# Patient Record
Sex: Female | Born: 1990 | Race: Black or African American | Hispanic: No | Marital: Single | Smoking: Never smoker
Health system: Southern US, Community
[De-identification: ages and names within clinical notes are randomized; demographics above are authoritative.]

## PROBLEM LIST (undated history)

## (undated) DIAGNOSIS — K219 Gastro-esophageal reflux disease without esophagitis: Secondary | ICD-10-CM

## (undated) DIAGNOSIS — F419 Anxiety disorder, unspecified: Secondary | ICD-10-CM

## (undated) HISTORY — PX: ANKLE SURGERY: SHX546

---

## 2015-08-28 ENCOUNTER — Encounter (HOSPITAL_COMMUNITY): Payer: Self-pay

## 2015-08-28 ENCOUNTER — Emergency Department (HOSPITAL_COMMUNITY): Payer: BLUE CROSS/BLUE SHIELD

## 2015-08-28 ENCOUNTER — Emergency Department (HOSPITAL_COMMUNITY)
Admission: EM | Admit: 2015-08-28 | Discharge: 2015-08-28 | Disposition: A | Discharge status: Home / Self Care | Payer: BLUE CROSS/BLUE SHIELD | Attending: Emergency Medicine | Admitting: Emergency Medicine

## 2015-08-28 DIAGNOSIS — Y9364 Activity, baseball: Secondary | ICD-10-CM | POA: Diagnosis not present

## 2015-08-28 DIAGNOSIS — Y998 Other external cause status: Secondary | ICD-10-CM | POA: Insufficient documentation

## 2015-08-28 DIAGNOSIS — X58XXXA Exposure to other specified factors, initial encounter: Secondary | ICD-10-CM | POA: Insufficient documentation

## 2015-08-28 DIAGNOSIS — S99911A Unspecified injury of right ankle, initial encounter: Secondary | ICD-10-CM | POA: Diagnosis present

## 2015-08-28 DIAGNOSIS — S82851A Displaced trimalleolar fracture of right lower leg, initial encounter for closed fracture: Secondary | ICD-10-CM | POA: Diagnosis not present

## 2015-08-28 DIAGNOSIS — Y9289 Other specified places as the place of occurrence of the external cause: Secondary | ICD-10-CM | POA: Diagnosis not present

## 2015-08-28 MED ORDER — KETAMINE HCL-SODIUM CHLORIDE 100-0.9 MG/10ML-% IV SOSY
0.3000 mg/kg | PREFILLED_SYRINGE | Freq: Once | INTRAVENOUS | Status: AC
  Administered 2015-08-28: 24 mg via INTRAVENOUS

## 2015-08-28 MED ORDER — MORPHINE SULFATE (PF) 4 MG/ML IV SOLN
4.0000 mg | Freq: Once | INTRAVENOUS | Status: AC
  Administered 2015-08-28: 4 mg via INTRAVENOUS
  Filled 2015-08-28: qty 1

## 2015-08-28 MED ORDER — ONDANSETRON HCL 4 MG/2ML IJ SOLN
4.0000 mg | Freq: Once | INTRAMUSCULAR | Status: AC
  Administered 2015-08-28: 4 mg via INTRAVENOUS
  Filled 2015-08-28: qty 2

## 2015-08-28 MED ORDER — HYDROCODONE-ACETAMINOPHEN 5-325 MG PO TABS: 1.0000 | ORAL_TABLET | Freq: Four times a day (QID) | ORAL | Status: DC | PRN

## 2015-08-28 MED ORDER — KETAMINE HCL 10 MG/ML IJ SOLN: 0.3000 mg/kg | Freq: Once | INTRAMUSCULAR | Status: DC

## 2015-08-28 MED ORDER — KETAMINE HCL-SODIUM CHLORIDE 100-0.9 MG/10ML-% IV SOSY
24.0000 mg | PREFILLED_SYRINGE | Freq: Once | INTRAVENOUS | Status: DC
  Filled 2015-08-28: qty 10

## 2015-08-28 NOTE — Discharge Instructions (Signed)
Ankle Fracture A fracture is a break in a bone. The ankle joint is made up of three bones. These include the lower (distal)sections of your lower leg bones, called the tibia and fibula, along with a bone in your foot, called the talus. Depending on how bad the break is and if more than one ankle joint bone is broken, a cast or splint is used to protect and keep your injured bone from moving while it heals. Sometimes, surgery is required to help the fracture heal properly.  There are two general types of fractures:  Stable fracture. This includes a single fracture line through one bone, with no injury to ankle ligaments. A fracture of the talus that does not have any displacement (movement of the bone on either side of the fracture line) is also stable.  Unstable fracture. This includes more than one fracture line through one or more bones in the ankle joint. It also includes fractures that have displacement of the bone on either side of the fracture line. CAUSES  A direct blow to the ankle.   Quickly and severely twisting your ankle.  Trauma, such as a car accident or falling from a significant height. RISK FACTORS You may be at a higher risk of ankle fracture if:  You have certain medical conditions.  You are involved in high-impact sports.  You are involved in a high-impact car accident. SIGNS AND SYMPTOMS   Tender and swollen ankle.  Bruising around the injured ankle.  Pain on movement of the ankle.  Difficulty walking or putting weight on the ankle.  A cold foot below the site of the ankle injury. This can occur if the blood vessels passing through your injured ankle were also damaged.  Numbness in the foot below the site of the ankle injury. DIAGNOSIS  An ankle fracture is usually diagnosed with a physical exam and X-rays. A CT scan may also be required for complex fractures. TREATMENT  Stable fractures are treated with a cast or splint and using crutches to avoid putting  weight on your injured ankle. This is followed by an ankle strengthening program. Some patients require a special type of cast, depending on other medical problems they may have. Unstable fractures require surgery to ensure the bones heal properly. Your health care provider will tell you what type of fracture you have and the best treatment for your condition. HOME CARE INSTRUCTIONS   Review correct crutch use with your health care provider and use your crutches as directed. Safe use of crutches is extremely important. Misuse of crutches can cause you to fall or cause injury to nerves in your hands or armpits.  Do not put weight or pressure on the injured ankle until directed by your health care provider.  To lessen the swelling, keep the injured leg elevated while sitting or lying down.  Apply ice to the injured area:  Put ice in a plastic bag.  Place a towel between your cast and the bag.  Leave the ice on for 20 minutes, 2-3 times a day.  If you have a plaster or fiberglass cast:  Do not try to scratch the skin under the cast with any objects. This can increase your risk of skin infection.  Check the skin around the cast every day. You may put lotion on any red or sore areas.  Keep your cast dry and clean.  If you have a plaster splint:  Wear the splint as directed.  You may loosen the elastic   around the splint if your toes become numb, tingle, or turn cold or blue.  Do not put pressure on any part of your cast or splint; it may break. Rest your cast only on a pillow the first 24 hours until it is fully hardened.  Your cast or splint can be protected during bathing with a plastic bag sealed to your skin with medical tape. Do not lower the cast or splint into water.  Take medicines as directed by your health care provider. Only take over-the-counter or prescription medicines for pain, discomfort, or fever as directed by your health care provider.  Do not drive a vehicle until  your health care provider specifically tells you it is safe to do so.  If your health care provider has given you a follow-up appointment, it is very important to keep that appointment. Not keeping the appointment could result in a chronic or permanent injury, pain, and disability. If you have any problem keeping the appointment, call the facility for assistance. SEEK MEDICAL CARE IF: You develop increased swelling or discomfort. SEEK IMMEDIATE MEDICAL CARE IF:   Your cast gets damaged or breaks.  You have continued severe pain.  You develop new pain or swelling after the cast was put on.  Your skin or toenails below the injury turn blue or gray.  Your skin or toenails below the injury feel cold, numb, or have loss of sensitivity to touch.  There is a bad smell or pus draining from under the cast. MAKE SURE YOU:   Understand these instructions.  Will watch your condition.  Will get help right away if you are not doing well or get worse.   This information is not intended to replace advice given to you by your health care provider. Make sure you discuss any questions you have with your health care provider.   Document Released: 05/05/2000 Document Revised: 05/13/2013 Document Reviewed: 12/05/2012 Elsevier Interactive Patient Education 2016 [employer]  

## 2015-08-28 NOTE — ED Notes (Signed)
April LauthPlunkett MD, Brett Canalesoy Ortho tech Rushie ChestnutBrian Le Faulcon RN at beside for reduction of right ankle. No sedation was used. Analgesics only. Tolerated well by patient

## 2015-08-28 NOTE — ED Notes (Signed)
Pt stable, ambulatory, states understanding of discharge instructions 

## 2015-08-28 NOTE — ED Notes (Addendum)
Patient arrived via EMS with an ankle injury after sliding into a base playing softball.  Heard a crack and laid on the ground.  Had a deformity on the right ankle, has pedal pulses present. EMS gave 150 mcg of Fentanyl

## 2015-08-28 NOTE — ED Provider Notes (Addendum)
CSN: [id number]     Arrival date & time 08/28/15  1931 History   First MD Initiated Contact with Patient 08/28/15 1932     Chief Complaint  Patient presents with  . Ankle Injury     (Consider location/radiation/quality/duration/timing/severity/associated sxs/prior Treatment) Patient is a 25 y.o. female presenting with lower extremity injury. The history is provided by the patient.  Ankle Injury This is a new (sliding into base and her foot went one way and leg went the other) problem. The current episode started 1 to 2 hours ago. The problem occurs constantly. The problem has not changed since onset.Associated symptoms comments: Right ankle pain.  No knee pain or head injury. Exacerbated by: weight bearing. Nothing relieves the symptoms. She has tried nothing for the symptoms. The treatment provided no relief.    History reviewed. No pertinent past medical history. History reviewed. No pertinent past surgical history. History reviewed. No pertinent family history. Social History  Substance Use Topics  . Smoking status: Never Smoker   . Smokeless tobacco: Never Used  . Alcohol Use: None   OB History    No data available     Review of Systems  All other systems reviewed and are negative.     Allergies  Review of patient's allergies indicates no known allergies.  Home Medications   Prior to Admission medications   Not on File   BP 127/74 mmHg  Pulse 104  Temp(Src) 98.2 F (36.8 C) (Oral)  Resp 18  Ht  (1.6 m)  Wt 175 lb (79.379 kg)  BMI 31.01 kg/m2  SpO2 99%  LMP 08/09/2015 (LMP Unknown) Physical Exam  Constitutional: She is oriented to person, place, and time. She appears well-developed and well-nourished. No distress.  HENT:  Head: Normocephalic and atraumatic.  Eyes: EOM are normal. Pupils are equal, round, and reactive to light.  Cardiovascular: Normal rate, regular rhythm, normal heart sounds and intact distal pulses.  Exam reveals no friction rub.   No  murmur heard. Pulmonary/Chest: Effort normal and breath sounds normal. She has no wheezes. She has no rales.  Musculoskeletal: She exhibits tenderness.       Right ankle: She exhibits decreased range of motion, swelling and deformity. Tenderness. Lateral malleolus and medial malleolus tenderness found.  No edema  Neurological: She is alert and oriented to person, place, and time. No cranial nerve deficit.  Skin: Skin is warm and dry. No rash noted.  Psychiatric: She has a normal mood and affect. Her behavior is normal.  Nursing note and vitals reviewed.   ED Course  Reduction of fracture Date/Time: 08/28/2015 9:00 PM Performed by: Gwyneth Sprout Authorized by: Gwyneth Sprout Consent: Verbal consent obtained. Risks and benefits: risks, benefits and alternatives were discussed Consent given by: patient Site marked: the operative site was marked Patient identity confirmed: verbally with patient Local anesthesia used: no Patient sedated: no Patient tolerance: Patient tolerated the procedure well with no immediate complications Comments: After analgesic doses of ketamine and morphine with gentle traction on the foot and plantar flexion facture dislocation reduced with a pop and appears in more appropriate alignment.   (including critical care time) Labs Review Labs Reviewed - No data to display  Imaging Review Dg Ankle Complete Right  08/28/2015  CLINICAL DATA:  Injury playing softball EXAM: RIGHT ANKLE - COMPLETE 3+ VIEW COMPARISON:  None. FINDINGS: Fine bone detail diminished secondary to overlying casting material. Fractures involving scratch set there is an oblique, intra-articular fracture involving the distal fibula. Medial  malleolar fracture is also identified. A fracture involving the anterior malleolus cannot be excluded. Finally, there is a posterior malleolar fracture. IMPRESSION: 1. Status post close reduction of trimalleolar fracture. Consider further assessment with CT of  the right ankle. 2. Cannot rule out anterior malleolar fracture. Electronically Signed   By: Signa Kellaylor  Stroud M.D.   On: 08/28/2015 21:35   I have personally reviewed and evaluated these images and lab results as part of my medical decision-making.   EKG Interpretation None      MDM   Final diagnoses:  Trimalleolar fracture, right, closed, initial encounter   Patient with an injury to the right ankle while playing softball today with obvious deformity consistent with fracture dislocation of the right ankle. She is neurovascularly intact. No other injury noted at this time. No knee pain or deformity. Patient was given ketamine analgesia and morphine. Then the fracture dislocation was reduced at bedside. Patient was placed in a [place] splint and taken for x-ray. Pain significantly improved after reduction.  9:56 PM Imaging consistent with trimal fx.  Alignment appropriate.  Pt has good neuro exam after splint.  Will d/c home to f/u with ortho.  Gwyneth SproutWhitney Othar Curto, MD 08/28/15 [id number]  Gwyneth SproutWhitney Alyissa Whidbee, MD 08/28/15 2158

## 2015-08-28 NOTE — Progress Notes (Signed)
Orthopedic Tech Progress Note Patient Details:  April Dean 05-30-1990 [id number] Assisted with reduction then applied fiberglass short leg splint with stirrup to RLE.  Pulses, sensation, motion intact before and after splinting.  Capillary refill less than 2 seconds before and after splinting. Ortho Devices Type of Ortho Device: Post (short leg) splint, Stirrup splint Ortho Device/Splint Location: RLE Ortho Device/Splint Interventions: Application   Lesle ChrisGilliland, Tinisha Etzkorn L 08/28/2015, 8:39 PM

## 2016-04-22 ENCOUNTER — Emergency Department (HOSPITAL_BASED_OUTPATIENT_CLINIC_OR_DEPARTMENT_OTHER)
Admission: EM | Admit: 2016-04-22 | Discharge: 2016-04-22 | Disposition: A | Discharge status: Home / Self Care | Payer: BLUE CROSS/BLUE SHIELD | Attending: Emergency Medicine | Admitting: Emergency Medicine

## 2016-04-22 ENCOUNTER — Encounter (HOSPITAL_BASED_OUTPATIENT_CLINIC_OR_DEPARTMENT_OTHER): Payer: Self-pay | Admitting: *Deleted

## 2016-04-22 DIAGNOSIS — J029 Acute pharyngitis, unspecified: Secondary | ICD-10-CM | POA: Diagnosis present

## 2016-04-22 DIAGNOSIS — H1031 Unspecified acute conjunctivitis, right eye: Secondary | ICD-10-CM | POA: Diagnosis not present

## 2016-04-22 DIAGNOSIS — J069 Acute upper respiratory infection, unspecified: Secondary | ICD-10-CM | POA: Insufficient documentation

## 2016-04-22 LAB — RAPID STREP SCREEN (MED CTR MEBANE ONLY): Streptococcus, Group A Screen (Direct): NEGATIVE

## 2016-04-22 MED ORDER — DEXAMETHASONE 6 MG PO TABS
12.0000 mg | ORAL_TABLET | Freq: Once | ORAL | Status: AC
  Administered 2016-04-22: 12 mg via ORAL
  Filled 2016-04-22: qty 2

## 2016-04-22 MED ORDER — FLUORESCEIN SODIUM 1 MG OP STRP
1.0000 | ORAL_STRIP | Freq: Once | OPHTHALMIC | Status: AC
  Administered 2016-04-22: 1 via OPHTHALMIC
  Filled 2016-04-22: qty 1

## 2016-04-22 MED ORDER — AMOXICILLIN 250 MG/5ML PO SUSR
500.0000 mg | Freq: Three times a day (TID) | ORAL | Status: DC
  Administered 2016-04-22: 500 mg via ORAL
  Filled 2016-04-22: qty 10

## 2016-04-22 MED ORDER — SULFACETAMIDE SODIUM 10 % OP SOLN: 1.0000 [drp] | OPHTHALMIC | 0 refills | Status: DC

## 2016-04-22 MED ORDER — AMOXICILLIN 250 MG/5ML PO SUSR: 500.0000 mg | Freq: Three times a day (TID) | ORAL | 0 refills | Status: DC

## 2016-04-22 MED ORDER — TETRACAINE HCL 0.5 % OP SOLN
2.0000 [drp] | Freq: Once | OPHTHALMIC | Status: AC
  Administered 2016-04-22: 2 [drp] via OPHTHALMIC
  Filled 2016-04-22: qty 4

## 2016-04-22 NOTE — ED Provider Notes (Signed)
[facility]   CSN: [id number] Arrival date & time: 04/22/16  2150   By signing my name below, I, Teofilo PodMatthew P. Jamison, attest that this documentation has been prepared under the direction and in the presence of Dione Boozeavid Zarek Relph, MD . Electronically Signed: Teofilo PodMatthew P. Jamison, ED Scribe. 04/22/2016. 11:14 PM.   History   Chief Complaint Chief Complaint  Patient presents with  . Eye Problem     The history is provided by the patient. No language interpreter was used.   HPI Comments:  April Dean is a 25 y.o. female who presents to the Emergency Department complaining of worseningright eye pain pain that began today. Pt complains of associated blurred vision, yellow eye drainage and redness. Pt reports using new contacts yesterday. Pt also complains of sore throat, productive cough with green sputum, chills, diaphoresis, sneezing, generalized body aches. Pt states that these symptoms have persisted for 1 week. Pt reports possible sick contact at work. Pt had a flu shot this year. No alleviating factors noted. Pt denies vomiting, diarrhea.    History reviewed. No pertinent past medical history.  There are no active problems to display for this patient.   Past Surgical History:  Procedure Laterality Date  . ANKLE SURGERY Right     OB History    No data available       Home Medications    Prior to Admission medications   Not on File    Family History History reviewed. No pertinent family history.  Social History Social History  Substance Use Topics  . Smoking status: Never Smoker  . Smokeless tobacco: Never Used  . Alcohol use No     Allergies   Robitussin 12 hour cough [dextromethorphan polistirex er]   Review of Systems Review of Systems  Constitutional: Positive for chills and diaphoresis. Negative for fever.  HENT: Positive for sneezing and sore throat.   Eyes: Positive for pain, discharge, redness and visual disturbance.    Respiratory: Positive for cough.   Gastrointestinal: Negative for vomiting.  Musculoskeletal: Positive for myalgias.  All other systems reviewed and are negative.    Physical Exam Updated Vital Signs BP 117/81 (BP Location: Left Arm)   Pulse 89   Temp 97.7 F (36.5 C) (Oral)   Resp 19   Ht 5\' 2"  (1.575 m)   Wt 174 lb (78.9 kg)   SpO2 100%   BMI 31.83 kg/m   Physical Exam  Constitutional: She is oriented to person, place, and time. She appears well-developed and well-nourished.  HENT:  Head: Normocephalic and atraumatic.  Eyes: EOM are normal. Pupils are equal, round, and reactive to light.  Mild erythema of the conjunctiva of right eye, no corneal foreign body, anterior chamber clear, no abnormal uptake of fluorescin dye.   Neck: Normal range of motion. Neck supple. No JVD present.  Cardiovascular: Normal rate, regular rhythm and normal heart sounds.   No murmur heard. Pulmonary/Chest: Effort normal and breath sounds normal. She has no wheezes. She has no rales. She exhibits no tenderness.  Abdominal: Soft. Bowel sounds are normal. She exhibits no distension and no mass. There is no tenderness.  Musculoskeletal: Normal range of motion. She exhibits no edema.  Lymphadenopathy:    She has no cervical adenopathy.  Neurological: She is alert and oriented to person, place, and time. No cranial nerve deficit. She exhibits normal muscle tone. Coordination normal.  Skin: Skin is warm and dry. No rash noted.  Psychiatric: She has a normal  mood and affect. Her behavior is normal. Judgment and thought content normal.  Nursing note and vitals reviewed.    ED Treatments / Results  DIAGNOSTIC STUDIES:  Oxygen Saturation is 100% on RA, normal by my interpretation.    COORDINATION OF CARE:  11:14 PM Discussed treatment plan with pt at bedside and pt agreed to plan.   Labs (all labs ordered are listed, but only abnormal results are displayed) Labs Reviewed  RAPID STREP SCREEN  (NOT AT [facility])  CULTURE, GROUP A STREP [facility])     Procedures Procedures (including critical care time)  Medications Ordered in ED Medications  dexamethasone (DECADRON) tablet 12 mg (not administered)  tetracaine (PONTOCAINE) 0.5 % ophthalmic solution 2 drop (2 drops Right Eye Given 04/22/16 2258)  fluorescein ophthalmic strip 1 strip (1 strip Right Eye Given 04/22/16 2258)     Initial Impression / Assessment and Plan / ED Course  I have reviewed the triage vital signs and the nursing notes.  Pertinent lab results that were available during my care of the patient were reviewed by me and considered in my medical decision making (see chart for details).  Clinical Course    Respiratory tract infection with ongoing. Went rhinorrhea. Conjunctivitis of the right eye. While all this appears to be part of a viral illness, I'm concerned about possible bacterial superinfection. She is given a prescription for amoxicillin and for sulfacetamide ophthalmic solution. Return precautions discussed.  Final Clinical Impressions(s) / ED Diagnoses   Final diagnoses:  Upper respiratory tract infection, unspecified type  Pharyngitis, unspecified etiology  Acute conjunctivitis of right eye, unspecified acute conjunctivitis type    New Prescriptions Discharge Medication List as of 04/22/2016 11:23 PM    START taking these medications   Details  amoxicillin (AMOXIL) 250 MG/5ML suspension Take 10 mLs (500 mg total) by mouth 3 (three) times daily., Starting Sat 04/22/2016, Print    sulfacetamide (BLEPH-10) 10 % ophthalmic solution Place 1-2 drops into the right eye every 3 (three) hours while awake., Starting Sat 04/22/2016, Print       I personally performed the services described in this documentation, which was scribed in my presence. The recorded information has been reviewed and is accurate.       Dione Boozeavid Jadynn Epping, MD 04/23/16 [id number]

## 2016-04-22 NOTE — ED Triage Notes (Addendum)
Pt reports right eye redness and drainage x 1 day.  Reports new contact use recently.   Also reports sore throat.

## 2016-04-22 NOTE — ED Notes (Signed)
Dr. Glick at BS 

## 2016-04-22 NOTE — ED Notes (Signed)
Pt given d/c instructions as per chart. Verbalizes understanding. No questions. Rx x 2. 

## 2016-04-24 ENCOUNTER — Telehealth: Payer: Self-pay | Admitting: *Deleted

## 2016-04-26 LAB — CULTURE, GROUP A STREP (THRC)

## 2016-06-13 ENCOUNTER — Encounter (HOSPITAL_BASED_OUTPATIENT_CLINIC_OR_DEPARTMENT_OTHER): Payer: Self-pay

## 2016-06-13 DIAGNOSIS — K029 Dental caries, unspecified: Secondary | ICD-10-CM | POA: Diagnosis not present

## 2016-06-13 DIAGNOSIS — K0889 Other specified disorders of teeth and supporting structures: Secondary | ICD-10-CM | POA: Diagnosis present

## 2016-06-13 NOTE — ED Triage Notes (Signed)
C/o right ear ache since filling to right lower tooth Jan 9-NAD-steady gait

## 2016-06-14 ENCOUNTER — Emergency Department (HOSPITAL_BASED_OUTPATIENT_CLINIC_OR_DEPARTMENT_OTHER)
Admission: EM | Admit: 2016-06-14 | Discharge: 2016-06-14 | Disposition: A | Discharge status: Home / Self Care | Payer: BLUE CROSS/BLUE SHIELD | Attending: Emergency Medicine | Admitting: Emergency Medicine

## 2016-06-14 DIAGNOSIS — K0889 Other specified disorders of teeth and supporting structures: Secondary | ICD-10-CM

## 2016-06-14 MED ORDER — IBUPROFEN 600 MG PO TABS: 600.0000 mg | ORAL_TABLET | Freq: Four times a day (QID) | ORAL | 0 refills | Status: DC | PRN

## 2016-06-14 NOTE — ED Provider Notes (Signed)
[facility]   CSN: [id number] Arrival date & time: 06/13/16  2126  History   Chief Complaint Chief Complaint  Patient presents with  . Otalgia    HPI April Dean is a 26 y.o. female.  HPI  26 y.o. female, presents to the Emergency Department today complaining of dental pain on right lower mandible. Notes radiation up to right ear. States pain 4/10 currently. No N/V. No trouble with PO intake. No fevers. Pt states seen by Dentist on 13th for filling. Notes pain in that area since then. Tylenol with minimal relief. No other symptoms noted.   History reviewed. No pertinent past medical history.  There are no active problems to display for this patient.   Past Surgical History:  Procedure Laterality Date  . ANKLE SURGERY Right     OB History    No data available       Home Medications    Prior to Admission medications   Not on File    Family History No family history on file.  Social History Social History  Substance Use Topics  . Smoking status: Never Smoker  . Smokeless tobacco: Never Used  . Alcohol use No     Allergies   Robitussin 12 hour cough [dextromethorphan polistirex er]   Review of Systems Review of Systems  Constitutional: Negative for fever.  HENT: Positive for dental problem. Negative for sore throat.   Respiratory: Negative for shortness of breath and stridor.   Gastrointestinal: Negative for nausea and vomiting.  Allergic/Immunologic: Negative for immunocompromised state.   Physical Exam Updated Vital Signs BP 127/81 (BP Location: Left Arm)   Pulse 75   Temp 97.7 F (36.5 C) (Oral)   Resp 18   Ht 5\' 2"  (1.575 m)   Wt 81.2 kg   LMP 06/09/2016   SpO2 100%   BMI 32.74 kg/m   Physical Exam  Constitutional: She is oriented to person, place, and time. Vital signs are normal. She appears well-developed and well-nourished.  HENT:  Head: Normocephalic and atraumatic.  Right Ear: Hearing normal.  Left  Ear: Hearing normal.  Mouth/Throat: Uvula is midline, oropharynx is clear and moist and mucous membranes are normal. No trismus in the jaw. Normal dentition. No dental abscesses or uvula swelling.  Dental filling noted right lower mandible. No abscess. No erythema. No trismus  Eyes: Conjunctivae and EOM are normal. Pupils are equal, round, and reactive to light.  Neck: Normal range of motion. Neck supple.  Cardiovascular: Normal rate and regular rhythm.   Pulmonary/Chest: Effort normal.  Neurological: She is alert and oriented to person, place, and time.  Skin: Skin is warm and dry.  Psychiatric: She has a normal mood and affect. Her speech is normal and behavior is normal. Thought content normal.  Nursing note and vitals reviewed.  ED Treatments / Results  Labs (all labs ordered are listed, but only abnormal results are displayed) Labs Reviewed - No data to display  EKG  EKG Interpretation None       Radiology No results found.  Procedures Procedures (including critical care time)  Medications Ordered in ED Medications - No data to display   Initial Impression / Assessment and Plan / ED Course  I have reviewed the triage vital signs and the nursing notes.  Pertinent labs & imaging results that were available during my care of the patient were reviewed by me and considered in my medical decision making (see chart for details).  Final Clinical Impressions(s) /  ED Diagnoses     {I have reviewed the relevant previous healthcare records.  {I obtained HPI from historian.   ED Course:  Assessment: Dental pain associated with dental cary but no signs or symptoms of dental abscess with patient afebrile, non toxic appearing and swallowing secretions well. Exam unconcerning for Ludwig's angina or other deep tissue infection in neck. As there is no facial swelling or gum findings, will not prescribe antibiotics at this time. Will treat with pain medication. Likely residual pain from  filling on nerve root.  I gave patient referral to dentist and stressed the importance of dental follow up for ultimate management of dental pain. Patient voices understanding and is agreeable to plan.  Pt also requested referral to OBGYN for birth control   Disposition/Plan:  DC Home Additional Verbal discharge instructions given and discussed with patient.  Pt Instructed to f/u with Dentist in the next week for evaluation and treatment of symptoms. Return precautions given Pt acknowledges and agrees with plan  Supervising Physician April Palumbo, MD  Final diagnoses:  Pain, dental    New Prescriptions New Prescriptions   No medications on file     Audry Piliyler Amaan Meyer, PA-C 06/14/16 [id number]    April Palumbo, MD 06/14/16 [phone]

## 2016-06-14 NOTE — Discharge Instructions (Signed)
Please read and follow all provided instructions.  Your diagnoses today include:  1. Pain, dental     Tests performed today include: Vital signs. See below for your results today.   Medications prescribed:  Take as prescribed   Home care instructions:  Follow any educational materials contained in this packet.  Follow-up instructions: Please follow-up with your Dentist for further evaluation of symptoms and treatment   Return instructions:  Please return to the Emergency Department if you do not get better, if you get worse, or new symptoms OR  - Fever (temperature greater than 101.27F)  - Bleeding that does not stop with holding pressure to the area    -Severe pain (please note that you may be more sore the day after your accident)  - Chest Pain  - Difficulty breathing  - Severe nausea or vomiting  - Inability to tolerate food and liquids  - Passing out  - Skin becoming red around your wounds  - Change in mental status (confusion or lethargy)  - New numbness or weakness    Please return if you have any other emergent concerns.  Additional Information:  Your vital signs today were: BP 127/81 (BP Location: Left Arm)    Pulse 75    Temp 97.7 F (36.5 C) (Oral)    Resp 18    Ht 5\' 2"  (1.575 m)    Wt 81.2 kg    LMP 06/09/2016    SpO2 100%    BMI 32.74 kg/m  If your blood pressure (BP) was elevated above 135/85 this visit, please have this repeated by your doctor within one month. ---------------

## 2017-01-17 ENCOUNTER — Encounter (HOSPITAL_BASED_OUTPATIENT_CLINIC_OR_DEPARTMENT_OTHER): Payer: Self-pay | Admitting: Emergency Medicine

## 2017-01-17 ENCOUNTER — Emergency Department (HOSPITAL_BASED_OUTPATIENT_CLINIC_OR_DEPARTMENT_OTHER): Payer: BLUE CROSS/BLUE SHIELD

## 2017-01-17 ENCOUNTER — Emergency Department (HOSPITAL_BASED_OUTPATIENT_CLINIC_OR_DEPARTMENT_OTHER)
Admission: EM | Admit: 2017-01-17 | Discharge: 2017-01-17 | Disposition: A | Discharge status: Home / Self Care | Payer: BLUE CROSS/BLUE SHIELD | Attending: Emergency Medicine | Admitting: Emergency Medicine

## 2017-01-17 DIAGNOSIS — M25571 Pain in right ankle and joints of right foot: Secondary | ICD-10-CM | POA: Insufficient documentation

## 2017-01-17 DIAGNOSIS — M79671 Pain in right foot: Secondary | ICD-10-CM

## 2017-01-17 MED ORDER — OXYCODONE-ACETAMINOPHEN 5-325 MG PO TABS: 1.0000 | ORAL_TABLET | Freq: Once | ORAL | Status: DC

## 2017-01-17 MED ORDER — ACETAMINOPHEN 325 MG PO TABS
650.0000 mg | ORAL_TABLET | Freq: Once | ORAL | Status: AC
  Administered 2017-01-17: 650 mg via ORAL
  Filled 2017-01-17: qty 2

## 2017-01-17 MED ORDER — IBUPROFEN 400 MG PO TABS
400.0000 mg | ORAL_TABLET | Freq: Once | ORAL | Status: AC
  Administered 2017-01-17: 400 mg via ORAL
  Filled 2017-01-17: qty 1

## 2017-01-17 NOTE — ED Notes (Signed)
Pt drove self , percocet held

## 2017-01-17 NOTE — ED Notes (Signed)
ED Provider at bedside. 

## 2017-01-17 NOTE — Discharge Instructions (Signed)
Follow up with your surgeon tomorrow, return for worsening symptoms, fever. Schedule an appointment with Dr. Linna CapriceSwinteck if you would like a second opinion on your ankle.

## 2017-01-17 NOTE — ED Triage Notes (Addendum)
Patient reports right foot swelling, burning and tingling x 1 month.  Reports previous surgery on 08/2015.  States foot has been okay until past month.  Reports follow up with orthopedist tomorrow.  Patient presents with cam walker in place.  States she began wearing this yesterday

## 2017-01-17 NOTE — ED Provider Notes (Signed)
[facility] Provider Note   CSN: [id number] Arrival date & time: 01/17/17  1706     History   Chief Complaint Chief Complaint  Patient presents with  . Foot Swelling    HPI April Dean is a 26 y.o. female.  26 yo F with a chief complaint of right ankle pain. This been a chronic issue for her. She had a fracture to the ankle sometime ago. Has been having swelling in pain over the past months or so. Saw her orthopedic physician about a month ago. Started on gabapentin. Denies any improvement of symptoms. Denies fever erythema. Denies trauma. She does admit to increasing her amount on her feet recently. Has been walking quite a bit.   The history is provided by the patient.  Illness  This is a new problem. The current episode started more than 1 week ago. The problem occurs constantly. The problem has been gradually worsening. Pertinent negatives include no chest pain, no headaches and no shortness of breath. The symptoms are aggravated by walking, bending, twisting and standing. Nothing relieves the symptoms. She has tried nothing for the symptoms. The treatment provided no relief.    History reviewed. No pertinent past medical history.  There are no active problems to display for this patient.   Past Surgical History:  Procedure Laterality Date  . ANKLE SURGERY Right     OB History    No data available       Home Medications    Prior to Admission medications   Medication Sig Start Date End Date Taking? Authorizing Provider  ibuprofen (ADVIL,MOTRIN) 600 MG tablet Take 1 tablet (600 mg total) by mouth every 6 (six) hours as needed. 06/14/16   Audry Pili, PA-C    Family History History reviewed. No pertinent family history.  Social History Social History  Substance Use Topics  . Smoking status: Never Smoker  . Smokeless tobacco: Never Used  . Alcohol use No     Allergies   Robitussin 12 hour cough [dextromethorphan polistirex er]   Review of  Systems Review of Systems  Constitutional: Negative for chills and fever.  HENT: Negative for congestion and rhinorrhea.   Eyes: Negative for redness and visual disturbance.  Respiratory: Negative for shortness of breath and wheezing.   Cardiovascular: Negative for chest pain and palpitations.  Gastrointestinal: Negative for nausea and vomiting.  Genitourinary: Negative for dysuria and urgency.  Musculoskeletal: Positive for arthralgias and myalgias.  Skin: Negative for pallor and wound.  Neurological: Negative for dizziness and headaches.     Physical Exam Updated Vital Signs BP 125/74 (BP Location: Right Arm)   Pulse 79   Temp 98 F (36.7 C) (Oral)   Resp 20   Ht 5\' 2"  (1.575 m)   Wt 79.8 kg (176 lb)   LMP 01/16/2017 (Exact Date)   SpO2 100%   BMI 32.19 kg/m   Physical Exam  Constitutional: She is oriented to person, place, and time. She appears well-developed and well-nourished. No distress.  HENT:  Head: Normocephalic and atraumatic.  Eyes: Pupils are equal, round, and reactive to light. EOM are normal.  Neck: Normal range of motion. Neck supple.  Cardiovascular: Normal rate and regular rhythm.  Exam reveals no gallop and no friction rub.   No murmur heard. Pulmonary/Chest: Effort normal. She has no wheezes. She has no rales.  Abdominal: Soft. She exhibits no distension and no mass. There is no tenderness. There is no guarding.  Musculoskeletal: She exhibits edema and tenderness.  Tenderness in the soft tissue below the medial malleolus of the right ankle. Intact pulse motor and sensation. Range of motion without significant tenderness. Not warm. No erythema.  Neurological: She is alert and oriented to person, place, and time.  Skin: Skin is warm and dry. She is not diaphoretic.  Psychiatric: She has a normal mood and affect. Her behavior is normal.  Nursing note and vitals reviewed.    ED Treatments / Results  Labs (all labs ordered are listed, but only abnormal  results are displayed) Labs Reviewed - No data to display  EKG  EKG Interpretation None       Radiology No results found.  Procedures Procedures (including critical care time)  Medications Ordered in ED Medications  oxyCODONE-acetaminophen (PERCOCET/ROXICET) 5-325 MG per tablet 1 tablet (not administered)  ibuprofen (ADVIL,MOTRIN) tablet 400 mg (400 mg Oral Given 01/17/17 1902)  acetaminophen (TYLENOL) tablet 650 mg (650 mg Oral Given 01/17/17 1902)     Initial Impression / Assessment and Plan / ED Course  I have reviewed the triage vital signs and the nursing notes.  Pertinent labs & imaging results that were available during my care of the patient were reviewed by me and considered in my medical decision making (see chart for details).     26 yo F With chronic right ankle pain. Does not appear to be infected. I doubt this is septic arthritis. She has an appointment with her orthopedic doctor tomorrow. She asked for a second opinion. Was given a referral to the orthopedist on call. Tylenol and NSAIDs. Discharge home.  7:21 PM:  I have discussed the diagnosis/risks/treatment options with the patient and believe the pt to be eligible for discharge home to follow-up with PCP, ortho. We also discussed returning to the ED immediately if new or worsening sx occur. We discussed the sx which are most concerning (e.g., sudden worsening pain, fever, inability to tolerate by mouth) that necessitate immediate return. Medications administered to the patient during their visit and any new prescriptions provided to the patient are listed below.  Medications given during this visit Medications  oxyCODONE-acetaminophen (PERCOCET/ROXICET) 5-325 MG per tablet 1 tablet (not administered)  ibuprofen (ADVIL,MOTRIN) tablet 400 mg (400 mg Oral Given 01/17/17 1902)  acetaminophen (TYLENOL) tablet 650 mg (650 mg Oral Given 01/17/17 1902)     The patient appears reasonably screen and/or stabilized for  discharge and I doubt any other medical condition or other [facility] requiring further screening, evaluation, or treatment in the ED at this time prior to discharge.    Final Clinical Impressions(s) / ED Diagnoses   Final diagnoses:  Foot pain, right    New Prescriptions Discharge Medication List as of 01/17/2017  6:54 PM       Melene PlanFloyd, Janice Seales, DO 01/17/17 1921

## 2017-11-15 ENCOUNTER — Emergency Department (HOSPITAL_BASED_OUTPATIENT_CLINIC_OR_DEPARTMENT_OTHER): Payer: BLUE CROSS/BLUE SHIELD

## 2017-11-15 ENCOUNTER — Other Ambulatory Visit: Payer: Self-pay

## 2017-11-15 ENCOUNTER — Emergency Department (HOSPITAL_BASED_OUTPATIENT_CLINIC_OR_DEPARTMENT_OTHER)
Admission: EM | Admit: 2017-11-15 | Discharge: 2017-11-15 | Disposition: A | Discharge status: Home / Self Care | Payer: BLUE CROSS/BLUE SHIELD | Attending: Emergency Medicine | Admitting: Emergency Medicine

## 2017-11-15 ENCOUNTER — Encounter (HOSPITAL_BASED_OUTPATIENT_CLINIC_OR_DEPARTMENT_OTHER): Payer: Self-pay | Admitting: Emergency Medicine

## 2017-11-15 DIAGNOSIS — G8929 Other chronic pain: Secondary | ICD-10-CM | POA: Insufficient documentation

## 2017-11-15 DIAGNOSIS — M25571 Pain in right ankle and joints of right foot: Secondary | ICD-10-CM | POA: Diagnosis not present

## 2017-11-15 MED ORDER — KETOROLAC TROMETHAMINE 60 MG/2ML IM SOLN
60.0000 mg | Freq: Once | INTRAMUSCULAR | Status: AC
  Administered 2017-11-15: 60 mg via INTRAMUSCULAR
  Filled 2017-11-15: qty 2

## 2017-11-15 MED ORDER — DICLOFENAC SODIUM 1 % TD GEL: 2.0000 g | Freq: Four times a day (QID) | TRANSDERMAL | 1 refills | Status: AC

## 2017-11-15 NOTE — Discharge Instructions (Signed)
Ice and elevate your foot when you can.  Wear your compression anklet when up and walking or at work.  Make sure you wear a good shoe to work.  Continue taking tylenol.

## 2017-11-15 NOTE — ED Provider Notes (Signed)
[facility] EMERGENCY DEPARTMENT Provider Note   CSN: [id number] Arrival date & time: 11/15/17  1704     History   Chief Complaint Chief Complaint  Patient presents with  . Ankle Pain    HPI April Dean is a 27 y.o. female.  Patient is a 27 year old female presenting today with right ankle pain.  Patient has a significant past medical history of ankle fracture with hardware placement 2 years ago after sliding into a base while playing softball.  She then had the hardware removed approximately 1 year ago and states since the hardware is been removed she has had ongoing issues with her ankle.  She typically has pain every day but today the pain was 10 out of 10 and she can barely walk on it.  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  She normally works 8 hours but today she worked 12 hours and thinks that is why the pain is worse.  She denies any new trauma or falls.  She has noticed some swelling of the ankle despite wearing her compression sock. She does take Tylenol arthritis as needed for pain which she has taken today.  She states all the surgeries were done by Dr. Samuel Bouche in [facility] but she does not wish to see him any longer.    Ankle Pain      History reviewed. No pertinent past medical history.  There are no active problems to display for this patient.   Past Surgical History:  Procedure Laterality Date  . ANKLE SURGERY Right      OB History   None      Home Medications    Prior to Admission medications   Medication Sig Start Date End Date Taking? Authorizing Provider  ibuprofen (ADVIL,MOTRIN) 600 MG tablet Take 1 tablet (600 mg total) by mouth every 6 (six) hours as needed. 06/14/16   Audry Pili, PA-C    Family History History reviewed. No pertinent family history.  Social History Social History   Tobacco Use  . Smoking status: Never Smoker  . Smokeless tobacco: Never Used  Substance Use Topics  . Alcohol  use: No  . Drug use: No     Allergies   Robitussin 12 hour cough [dextromethorphan polistirex er]   Review of Systems Review of Systems  All other systems reviewed and are negative.    Physical Exam Updated Vital Signs BP 106/64 (BP Location: Left Arm)   Pulse 90   Temp 99.3 F (37.4 C) (Oral)   Resp 18   Ht 5\' 2"  (1.575 m)   Wt 83.9 kg (185 lb)   LMP 11/14/2017 (Approximate)   SpO2 98%   BMI 33.84 kg/m   Physical Exam  Constitutional: She appears well-developed and well-nourished. No distress.  HENT:  Head: Normocephalic and atraumatic.  Cardiovascular: Normal rate.  Pulmonary/Chest: Effort normal.  Musculoskeletal: She exhibits tenderness.       Right ankle: She exhibits decreased range of motion and swelling. She exhibits no ecchymosis, no deformity and normal pulse. Tenderness. Lateral malleolus and medial malleolus tenderness found. No head of 5th metatarsal and no proximal fibula tenderness found. Achilles tendon normal.  Nursing note and vitals reviewed.    ED Treatments / Results  Labs (all labs ordered are listed, but only abnormal results are displayed) Labs Reviewed - No data to display  EKG None  Radiology Dg Ankle Complete Right  Result Date: 11/15/2017 CLINICAL DATA:  History  of right ankle fracture with hardware placement and removal. Right ankle pain. No recent injury. EXAM: RIGHT ANKLE - COMPLETE 3+ VIEW COMPARISON:  08/28/2015 right ankle radiographs FINDINGS: Mild diffuse soft tissue swelling. No acute fracture. No subluxation. Healed deformity in the right distal fibula and medial malleolus. Ghost holes are noted in the right distal fibula and distal tibia. 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 No suspicious focal osseous lesions. No radiopaque foreign bodies. 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 No acute fracture or subluxation. Healed deformities in the right distal fibula and medial  malleolus with ghost holes from prior hardware. Electronically Signed   By: Delbert PhenixJason A Poff M.D.   On: 11/15/2017 17:41    Procedures Procedures (including critical care time)  Medications Ordered in ED Medications - No data to display   Initial Impression / Assessment and Plan / ED Course  I have reviewed the triage vital signs and the nursing notes.  Pertinent labs & imaging results that were available during my care of the patient were reviewed by me and considered in my medical decision making (see chart for details).    Patient presenting today with worsening right ankle pain with a history of chronic ankle pain after an injury resulting in surgery and hardware.  Patient was standing on her feet longer today which is most likely the cause of the swelling and pain.  X-ray shows mild osteoarthritis in the tibiotalar joint but no acute fractures or subluxations.  She has healed deformities in the right distal fibula and medial malleolus with ghost holes from prior hardware.  Patient has no evidence of joint erythema or concern for septic joint.  Pulses are intact.  Patient encouraged to continue wearing her compression sock as well as elevating and icing the ankle.  She was also given Voltaren gel and encouraged to continue Tylenol arthritis.  She was given follow-up with sports medicine.    Final Clinical Impressions(s) / ED Diagnoses   Final diagnoses:  Chronic pain of right ankle    ED Discharge Orders        Ordered    diclofenac sodium (VOLTAREN) 1 % GEL  4 times daily     11/15/17 1755       Gwyneth SproutPlunkett, Maryclare Nydam, MD 11/15/17 1755

## 2017-11-15 NOTE — ED Triage Notes (Signed)
Patient reports right ankle pain after standing on it all day today.  Reports previously had 2 surgeries to this ankle.

## 2018-02-17 ENCOUNTER — Encounter (HOSPITAL_BASED_OUTPATIENT_CLINIC_OR_DEPARTMENT_OTHER): Payer: Self-pay | Admitting: Emergency Medicine

## 2018-02-17 ENCOUNTER — Emergency Department (HOSPITAL_BASED_OUTPATIENT_CLINIC_OR_DEPARTMENT_OTHER)
Admission: EM | Admit: 2018-02-17 | Discharge: 2018-02-17 | Disposition: A | Discharge status: Home / Self Care | Payer: BLUE CROSS/BLUE SHIELD | Attending: Emergency Medicine | Admitting: Emergency Medicine

## 2018-02-17 ENCOUNTER — Other Ambulatory Visit: Payer: Self-pay

## 2018-02-17 DIAGNOSIS — R102 Pelvic and perineal pain: Secondary | ICD-10-CM | POA: Diagnosis present

## 2018-02-17 DIAGNOSIS — N390 Urinary tract infection, site not specified: Secondary | ICD-10-CM | POA: Diagnosis not present

## 2018-02-17 LAB — URINALYSIS, ROUTINE W REFLEX MICROSCOPIC
BILIRUBIN URINE: NEGATIVE
Glucose, UA: NEGATIVE mg/dL
HGB URINE DIPSTICK: NEGATIVE
KETONES UR: NEGATIVE mg/dL
Nitrite: NEGATIVE
PH: 7.5 (ref 5.0–8.0)
Protein, ur: NEGATIVE mg/dL
Specific Gravity, Urine: 1.015 (ref 1.005–1.030)

## 2018-02-17 LAB — BASIC METABOLIC PANEL
Anion gap: 8 (ref 5–15)
BUN: 7 mg/dL (ref 6–20)
CHLORIDE: 102 mmol/L (ref 98–111)
CO2: 28 mmol/L (ref 22–32)
Calcium: 8.8 mg/dL — ABNORMAL LOW (ref 8.9–10.3)
Creatinine, Ser: 0.65 mg/dL (ref 0.44–1.00)
GFR calc non Af Amer: >60 mL/min (ref >60)
Glucose, Bld: 92 mg/dL (ref 70–99)
Potassium: 3.7 mmol/L (ref 3.5–5.1)
SODIUM: 138 mmol/L (ref 135–145)

## 2018-02-17 LAB — URINALYSIS, MICROSCOPIC (REFLEX): RBC / HPF: NONE SEEN RBC/hpf (ref 0–5)

## 2018-02-17 LAB — WET PREP, GENITAL
Clue Cells Wet Prep HPF POC: NONE SEEN
SPERM: NONE SEEN
Trich, Wet Prep: NONE SEEN
Yeast Wet Prep HPF POC: NONE SEEN

## 2018-02-17 LAB — CBC WITH DIFFERENTIAL/PLATELET
Basophils Absolute: 0 10*3/uL (ref 0.0–0.1)
Basophils Relative: 1 %
Eosinophils Absolute: 0 10*3/uL (ref 0.0–0.7)
Eosinophils Relative: 1 %
HEMATOCRIT: 36.3 % (ref 36.0–46.0)
HEMOGLOBIN: 11.9 g/dL — AB (ref 12.0–15.0)
LYMPHS ABS: 1.5 10*3/uL (ref 0.7–4.0)
LYMPHS PCT: 44 %
MCH: 28 pg (ref 26.0–34.0)
MCHC: 32.8 g/dL (ref 30.0–36.0)
MCV: 85.4 fL (ref 78.0–100.0)
MONOS PCT: 12 %
Monocytes Absolute: 0.4 10*3/uL (ref 0.1–1.0)
NEUTROS PCT: 42 %
Neutro Abs: 1.4 10*3/uL — ABNORMAL LOW (ref 1.7–7.7)
Platelets: 394 10*3/uL (ref 150–400)
RBC: 4.25 MIL/uL (ref 3.87–5.11)
RDW: 13.8 % (ref 11.5–15.5)
WBC: 3.4 10*3/uL — AB (ref 4.0–10.5)

## 2018-02-17 LAB — PREGNANCY, URINE: Preg Test, Ur: NEGATIVE

## 2018-02-17 MED ORDER — KETOROLAC TROMETHAMINE 30 MG/ML IJ SOLN
30.0000 mg | Freq: Once | INTRAMUSCULAR | Status: AC
  Administered 2018-02-17: 30 mg via INTRAVENOUS
  Filled 2018-02-17: qty 1

## 2018-02-17 MED ORDER — CEPHALEXIN 500 MG PO CAPS: 500.0000 mg | ORAL_CAPSULE | Freq: Four times a day (QID) | ORAL | 0 refills | Status: AC

## 2018-02-17 MED ORDER — CEPHALEXIN 250 MG PO CAPS
500.0000 mg | ORAL_CAPSULE | Freq: Once | ORAL | Status: AC
  Administered 2018-02-17: 500 mg via ORAL
  Filled 2018-02-17: qty 2

## 2018-02-17 MED ORDER — NAPROXEN 500 MG PO TABS: 500.0000 mg | ORAL_TABLET | Freq: Two times a day (BID) | ORAL | 0 refills | Status: DC

## 2018-02-17 NOTE — ED Notes (Signed)
Pt unable to void at this time. 

## 2018-02-17 NOTE — ED Triage Notes (Signed)
Patient states that she is having vaginal d/c and pelvic pain and left flank pain since yesterday

## 2018-02-17 NOTE — Discharge Instructions (Signed)
Return to ED for worsening symptoms, severe pelvic pain or abdominal pain, abnormal vaginal bleeding, vomiting or coughing up blood, lightheadedness or loss of consciousness.

## 2018-02-17 NOTE — ED Provider Notes (Signed)
[facility] EMERGENCY DEPARTMENT Provider Note   CSN: [id number] Arrival date & time: 02/17/18  1010     History   Chief Complaint Chief Complaint  Patient presents with  . Pelvic Pain    HPI April Dean is a 27 y.o. female who presents to ED for evaluation of 1 day history of vaginal discharge described as yellow/white and 2-day history of lower abdominal/suprapubic pain radiating to her left flank.  States that she will sometimes have vaginal discharge related to her BV.  She denies any frequent history of UTIs or pyelonephritis.  She is not taking any medication help with the pain.  States that she is sexually active with protection but "not like that all the time."  She is not concerned about any STDs and denies possibility of pregnancy.  Denies any prior abdominal surgeries, fever, history of kidney stones, vomiting, changes to bowel movements, abnormal vaginal bleeding.  HPI  History reviewed. No pertinent past medical history.  There are no active problems to display for this patient.   Past Surgical History:  Procedure Laterality Date  . ANKLE SURGERY Right      OB History   None      Home Medications    Prior to Admission medications   Medication Sig Start Date End Date Taking? Authorizing Provider  cephALEXin (KEFLEX) 500 MG capsule Take 1 capsule (500 mg total) by mouth 4 (four) times daily for 10 days. 02/17/18 02/27/18  Gean Larose, PA-C  diclofenac sodium (VOLTAREN) 1 % GEL Apply 2 g topically 4 (four) times daily. 11/15/17   Gwyneth Sprout, MD  ibuprofen (ADVIL,MOTRIN) 600 MG tablet Take 1 tablet (600 mg total) by mouth every 6 (six) hours as needed. 06/14/16   Audry Pili, PA-C  naproxen (NAPROSYN) 500 MG tablet Take 1 tablet (500 mg total) by mouth 2 (two) times daily. 02/17/18   Dietrich Pates, PA-C    Family History History reviewed. No pertinent family history.  Social History Social History   Tobacco Use  . Smoking status: Never  Smoker  . Smokeless tobacco: Never Used  Substance Use Topics  . Alcohol use: No  . Drug use: No     Allergies   Robitussin 12 hour cough [dextromethorphan polistirex er]   Review of Systems Review of Systems  Constitutional: Negative for appetite change, chills and fever.  HENT: Negative for ear pain, rhinorrhea, sneezing and sore throat.   Eyes: Negative for photophobia and visual disturbance.  Respiratory: Negative for cough, chest tightness, shortness of breath and wheezing.   Cardiovascular: Negative for chest pain and palpitations.  Gastrointestinal: Negative for abdominal pain, blood in stool, constipation, diarrhea, nausea and vomiting.  Genitourinary: Positive for dysuria, flank pain and vaginal discharge. Negative for decreased urine volume, hematuria, pelvic pain, urgency, vaginal bleeding and vaginal pain.  Musculoskeletal: Negative for myalgias.  Skin: Negative for rash.  Neurological: Negative for dizziness, weakness and light-headedness.     Physical Exam Updated Vital Signs BP 117/73 (BP Location: Left Arm)   Pulse 62   Temp 99.5 F (37.5 C) (Oral)   Resp 18   Ht 5\' 2"  (1.575 m)   Wt 78.9 kg   LMP 01/20/2018   SpO2 100%   BMI 31.83 kg/m   Physical Exam  Constitutional: She appears well-developed and well-nourished. No distress.  HENT:  Head: Normocephalic and atraumatic.  Nose: Nose normal.  Eyes: Conjunctivae and EOM are normal. Right eye exhibits no discharge. Left eye exhibits no discharge. No scleral  icterus.  Neck: Normal range of motion. Neck supple.  Cardiovascular: Normal rate, regular rhythm, normal heart sounds and intact distal pulses. Exam reveals no gallop and no friction rub.  No murmur heard. Pulmonary/Chest: Effort normal and breath sounds normal. No respiratory distress.  Abdominal: Soft. Bowel sounds are normal. She exhibits no distension. There is no tenderness. There is no guarding.  Genitourinary: Cervix exhibits no motion  tenderness and no friability. Right adnexum displays no tenderness. Left adnexum displays no tenderness. Vaginal discharge found.  Genitourinary Comments: Pelvic exam: normal external genitalia without evidence of trauma. VULVA: normal appearing vulva with no masses, tenderness or lesion. VAGINA: normal appearing vagina with normal color and thick white discharge, no lesions. CERVIX: normal appearing cervix without lesions, cervical motion tenderness absent, cervical os closed with out purulent discharge; No vaginal discharge. Wet prep and DNA probe for chlamydia and GC obtained.   ADNEXA: normal adnexa in size, nontender and no masses UTERUS: uterus is normal size, shape, consistency and nontender.   RN served as Biomedical engineer during exam.  Musculoskeletal: Normal range of motion. She exhibits no edema.  Neurological: She is alert. She exhibits normal muscle tone. Coordination normal.  Skin: Skin is warm and dry. No rash noted.  Psychiatric: She has a normal mood and affect.  Nursing note and vitals reviewed.    ED Treatments / Results  Labs (all labs ordered are listed, but only abnormal results are displayed) Labs Reviewed  WET PREP, GENITAL - Abnormal; Notable for the following components:      Result Value   WBC, Wet Prep HPF POC MANY (*)    All other components within normal limits  URINALYSIS, ROUTINE W REFLEX MICROSCOPIC - Abnormal; Notable for the following components:   APPearance CLOUDY (*)    Leukocytes, UA MODERATE (*)    All other components within normal limits  BASIC METABOLIC PANEL - Abnormal; Notable for the following components:   Calcium 8.8 (*)    All other components within normal limits  CBC WITH DIFFERENTIAL/PLATELET - Abnormal; Notable for the following components:   WBC 3.4 (*)    Hemoglobin 11.9 (*)    Neutro Abs 1.4 (*)    All other components within normal limits  URINALYSIS, MICROSCOPIC (REFLEX) - Abnormal; Notable for the following components:    Bacteria, UA MANY (*)    All other components within normal limits  PREGNANCY, URINE  GC/CHLAMYDIA PROBE AMP ([place]) NOT AT [facility]    EKG None  Radiology No results found.  Procedures Procedures (including critical care time)  Medications Ordered in ED Medications  cephALEXin (KEFLEX) capsule 500 mg (has no administration in time range)  ketorolac (TORADOL) 30 MG/ML injection 30 mg (30 mg Intravenous Given 02/17/18 1210)     Initial Impression / Assessment and Plan / ED Course  I have reviewed the triage vital signs and the nursing notes.  Pertinent labs & imaging results that were available during my care of the patient were reviewed by me and considered in my medical decision making (see chart for details).     27 year old female presents to ED for evaluation of 1 day history of vaginal discharge and 2-day history of lower abdominal/suprapubic pain radiating to left flank.  She reports dysuria as well.  Denies concern of STDs or possibility of pregnancy.  Denies any changes to bowel movements, fever, vomiting or abnormal bleeding.  On exam she is overall well-appearing.  No significant abdominal tenderness to palpation without rebound or guarding noted.  Pelvic exam revealed white vaginal discharge with no cervical motion tenderness, adnexal tenderness.  Wet prep shows WBCs but otherwise negative.  Analysis shows moderate leukocytes, many bacteria.  CBC, BMP unremarkable.  Pregnancy test is negative.  GC chlamydia probe pending.  Will treat patient with antibiotics and anti-inflammatories for UTI.  Could have some component of mild pyelonephritis associated with it as well.  Will give follow-up with PCP and returning to ED for any severe worsening symptoms.  Portions of this note were generated with Scientist, clinical (histocompatibility and immunogenetics). Dictation errors may occur despite best attempts at proofreading.   Final Clinical Impressions(s) / ED Diagnoses   Final diagnoses:  Lower urinary  tract infectious disease    ED Discharge Orders         Ordered    cephALEXin (KEFLEX) 500 MG capsule  4 times daily     02/17/18 1227    naproxen (NAPROSYN) 500 MG tablet  2 times daily     02/17/18 1227           Dietrich Pates, PA-C 02/17/18 1229    Azalia Bilis, MD 02/17/18 1406

## 2018-02-18 LAB — GC/CHLAMYDIA PROBE AMP (~~LOC~~) NOT AT ARMC
CHLAMYDIA, DNA PROBE: NEGATIVE
NEISSERIA GONORRHEA: NEGATIVE

## 2018-03-31 ENCOUNTER — Encounter (HOSPITAL_BASED_OUTPATIENT_CLINIC_OR_DEPARTMENT_OTHER): Payer: Self-pay | Admitting: Emergency Medicine

## 2018-03-31 ENCOUNTER — Other Ambulatory Visit: Payer: Self-pay

## 2018-03-31 ENCOUNTER — Emergency Department (HOSPITAL_BASED_OUTPATIENT_CLINIC_OR_DEPARTMENT_OTHER)
Admission: EM | Admit: 2018-03-31 | Discharge: 2018-03-31 | Disposition: A | Discharge status: Home / Self Care | Payer: BLUE CROSS/BLUE SHIELD | Attending: Emergency Medicine | Admitting: Emergency Medicine

## 2018-03-31 DIAGNOSIS — M25532 Pain in left wrist: Secondary | ICD-10-CM | POA: Insufficient documentation

## 2018-03-31 NOTE — Discharge Instructions (Signed)
You have been seen today for wrist pain.  Antiinflammatory medications: Take 600 mg of ibuprofen every 6 hours or 440 mg (over the counter dose) to 500 mg (prescription dose) of naproxen every 12 hours for the next 3 days. After this time, these medications may be used as needed for pain. Take these medications with food to avoid upset stomach. Choose only one of these medications, do not take them together. Acetaminophen (generic for Tylenol): Should you continue to have additional pain while taking the ibuprofen or naproxen, you may add in acetaminophen as needed. Your daily total maximum amount of acetaminophen from all sources should be limited to 4000mg /day for persons without liver problems, or 2000mg /day for those with liver problems. Ice: May apply ice to the area over the next 24 hours for 15 minutes at a time to reduce swelling. Elevation: Keep the extremity elevated as often as possible to reduce pain and inflammation. Support: Wear the splint for support and comfort. Wear this until pain resolves.  Rest: Rest the wrist as much as possible. Follow up: If symptoms are improving, you may follow up with your primary care provider for any continued management. If symptoms are not starting to improve within a week, you should follow up with the orthopedic specialist within two weeks. Return: Return to the ED for numbness, weakness, increasing pain, overall worsening symptoms, loss of function, or if symptoms are not improving, you have tried to follow up with the orthopedic specialist, and have been unable to do so.  For prescription assistance, may try using prescription discount sites or apps, such as goodrx.com

## 2018-03-31 NOTE — ED Triage Notes (Signed)
Reports left wrist pain which began after lifting heavy items at work.

## 2018-03-31 NOTE — ED Notes (Signed)
ED Provider at bedside. 

## 2018-03-31 NOTE — ED Provider Notes (Signed)
[facility] EMERGENCY DEPARTMENT Provider Note   CSN: [id number] Arrival date & time: 03/31/18  1152     History   Chief Complaint Chief Complaint  Patient presents with  . Wrist Pain    HPI April Dean is a 26 y.o. female.  HPI   April Dean is a 27 y.o. female, patient with no pertinent past medical history, presenting to the ED with left wrist pain for about the past week.  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  This work involves repetitive motion that exacerbates her pain. Pain is mild to moderate, described as a soreness, located along the ulnar wrist, radiates to the mid forearm.  She has taken 1 dose of naproxen.  Denies trauma, weakness, numbness, elbow pain, or any other complaints.    History reviewed. No pertinent past medical history.  There are no active problems to display for this patient.   Past Surgical History:  Procedure Laterality Date  . ANKLE SURGERY Right      OB History   None      Home Medications    Prior to Admission medications   Medication Sig Start Date End Date Taking? Authorizing Provider  diclofenac sodium (VOLTAREN) 1 % GEL Apply 2 g topically 4 (four) times daily. 11/15/17   Gwyneth Sprout, MD  ibuprofen (ADVIL,MOTRIN) 600 MG tablet Take 1 tablet (600 mg total) by mouth every 6 (six) hours as needed. 06/14/16   Audry Pili, PA-C  naproxen (NAPROSYN) 500 MG tablet Take 1 tablet (500 mg total) by mouth 2 (two) times daily. 02/17/18   Dietrich Pates, PA-C    Family History History reviewed. No pertinent family history.  Social History Social History   Tobacco Use  . Smoking status: Never Smoker  . Smokeless tobacco: Never Used  Substance Use Topics  . Alcohol use: No  . Drug use: No     Allergies   Robitussin 12 hour cough [dextromethorphan polistirex er]   Review of Systems Review of Systems  Musculoskeletal: Positive for arthralgias. Negative for joint swelling.  Neurological:  Negative for weakness and numbness.     Physical Exam Updated Vital Signs BP 129/83 (BP Location: Right Arm)   Pulse 71   Temp 99 F (37.2 C) (Oral)   Resp 16   Ht 5\' 3"  (1.6 m)   Wt 78.9 kg   LMP 03/29/2018 (Approximate)   SpO2 100%   BMI 30.82 kg/m   Physical Exam  Constitutional: She appears well-developed and well-nourished. No distress.  HENT:  Head: Normocephalic and atraumatic.  Eyes: Conjunctivae are normal.  Neck: Neck supple.  Cardiovascular: Normal rate, regular rhythm and intact distal pulses.  Pulmonary/Chest: Effort normal.  Musculoskeletal: She exhibits tenderness. She exhibits no edema or deformity.  Minimal tenderness along the ulnar left wrist.  Pain with radial or ulnar deviation of the wrist.  No swelling, deformity, or crepitus. No tenderness or other abnormality noted to the hand or elbow.  Neurological: She is alert.  Sensation to light touch grossly intact in the left hand and arm. Grip strength equal bilaterally. Strength 5/5 through the cardinal directions of the left wrist and elbow.  Skin: Skin is warm and dry. Capillary refill takes less than 2 seconds. She is not diaphoretic. No pallor.  Psychiatric: She has a normal mood and affect. Her behavior is normal.  Nursing note and vitals reviewed.    ED Treatments / Results  Labs (all labs ordered are listed, but only abnormal  results are displayed) Labs Reviewed - No data to display  EKG None  Radiology No results found.  Procedures Procedures (including critical care time)  Medications Ordered in ED Medications - No data to display   Initial Impression / Assessment and Plan / ED Course  I have reviewed the triage vital signs and the nursing notes.  Pertinent labs & imaging results that were available during my care of the patient were reviewed by me and considered in my medical decision making (see chart for details).     She presents with left wrist pain.  Suspect this is due  to the repetitive motion from the patient's job.  She was given a temporary wrist splint with instructions for rest of the joint.  PCP versus orthopedic follow-up, as needed. The patient was given instructions for home care as well as return precautions. Patient voices understanding of these instructions, accepts the plan, and is comfortable with discharge.  Findings and plan of care discussed with Pricilla Loveless, MD. Dr. Criss Alvine personally evaluated and examined this patient.  Final Clinical Impressions(s) / ED Diagnoses   Final diagnoses:  Left wrist pain    ED Discharge Orders    None       Concepcion Living 03/31/18 1320    Pricilla Loveless, MD 03/31/18 1515

## 2018-08-16 ENCOUNTER — Other Ambulatory Visit: Payer: Self-pay

## 2018-08-16 ENCOUNTER — Emergency Department (HOSPITAL_BASED_OUTPATIENT_CLINIC_OR_DEPARTMENT_OTHER)
Admission: EM | Admit: 2018-08-16 | Discharge: 2018-08-16 | Disposition: A | Discharge status: Home / Self Care | Payer: BLUE CROSS/BLUE SHIELD | Attending: Emergency Medicine | Admitting: Emergency Medicine

## 2018-08-16 ENCOUNTER — Emergency Department (HOSPITAL_BASED_OUTPATIENT_CLINIC_OR_DEPARTMENT_OTHER): Payer: BLUE CROSS/BLUE SHIELD

## 2018-08-16 ENCOUNTER — Encounter (HOSPITAL_BASED_OUTPATIENT_CLINIC_OR_DEPARTMENT_OTHER): Payer: Self-pay

## 2018-08-16 DIAGNOSIS — J3489 Other specified disorders of nose and nasal sinuses: Secondary | ICD-10-CM | POA: Insufficient documentation

## 2018-08-16 DIAGNOSIS — R42 Dizziness and giddiness: Secondary | ICD-10-CM | POA: Diagnosis not present

## 2018-08-16 DIAGNOSIS — M791 Myalgia, unspecified site: Secondary | ICD-10-CM | POA: Insufficient documentation

## 2018-08-16 DIAGNOSIS — J069 Acute upper respiratory infection, unspecified: Secondary | ICD-10-CM | POA: Diagnosis not present

## 2018-08-16 DIAGNOSIS — R05 Cough: Secondary | ICD-10-CM | POA: Insufficient documentation

## 2018-08-16 DIAGNOSIS — R110 Nausea: Secondary | ICD-10-CM | POA: Diagnosis present

## 2018-08-16 DIAGNOSIS — B9789 Other viral agents as the cause of diseases classified elsewhere: Secondary | ICD-10-CM | POA: Insufficient documentation

## 2018-08-16 LAB — PREGNANCY, URINE: PREG TEST UR: NEGATIVE

## 2018-08-16 MED ORDER — ONDANSETRON 4 MG PO TBDP
4.0000 mg | ORAL_TABLET | Freq: Once | ORAL | Status: AC
  Administered 2018-08-16: 4 mg via ORAL
  Filled 2018-08-16: qty 1

## 2018-08-16 NOTE — ED Triage Notes (Signed)
Pt c/o nausea for two days and a runny nose, she has been able to eat and drink without issue and has not tried anything for her symptoms

## 2018-08-16 NOTE — Discharge Instructions (Signed)
You can take Tylenol or Ibuprofen as directed for pain. You can alternate Tylenol and Ibuprofen every 4 hours. If you take Tylenol at 1pm, then you can take Ibuprofen at 5pm. Then you can take Tylenol again at 9pm.   Make sure you drink plenty of fluids and stay hydrated.  As we discussed, given respiratory symptoms, you recommending corn pain for 2 weeks for conservative management.  The emergency department for fever, difficulty breathing, chest pain.   Coronavirus (COVID-19) Are you at risk?  Are you at risk for the Coronavirus (COVID-19)?  To be considered HIGH RISK for Coronavirus (COVID-19), you have to meet the following criteria:   Traveled to [place], [place], [place], [place] or [place]; or in the [place] to [place], [place], [place], or [place]; and have fever, cough, and shortness of breath within the last 2 weeks of travel OR  Been in close contact with a person diagnosed with COVID-19 within the last 2 weeks and have fever, cough, and shortness of breath  IF YOU DO NOT MEET THESE CRITERIA, YOU ARE CONSIDERED LOW RISK FOR COVID-19.  What to do if you are HIGH RISK for COVID-19?   If you are having a medical emergency, call 911.  Seek medical care right away. Before you go to a doctors office, urgent care or emergency department, call ahead and tell them about your recent travel, contact with someone diagnosed with COVID-19, and your symptoms. You should receive instructions from your physicians office regarding next steps of care.   When you arrive at healthcare provider, tell the healthcare staff immediately you have returned from visiting [place], [place], [place], [place] or [place]; or traveled in the [place] to [place], [place], [place], or [place]; in the last two weeks or you have been in close contact with a person diagnosed with COVID-19 in the last 2 weeks.    Tell the health care staff about your symptoms: fever, cough and shortness of  breath.  After you have been seen by a medical provider, you will be either: o Tested for (COVID-19) and discharged home on quarantine except to seek medical care if symptoms worsen, and asked to  - Stay home and avoid contact with others until you get your results (4-5 days)  - Avoid travel on public transportation if possible (such as bus, train, or airplane) or o Sent to the Emergency Department by EMS for evaluation, COVID-19 testing, and possible admission depending on your condition and test results.  What to do if you are LOW RISK for COVID-19?  Reduce your risk of any infection by using the same precautions used for avoiding the common cold or flu:   Wash your hands often with soap and warm water for at least 20 seconds.  If soap and water are not readily available, use an alcohol-based hand sanitizer with at least 60% alcohol.   If coughing or sneezing, cover your mouth and nose by coughing or sneezing into the elbow areas of your shirt or coat, into a tissue or into your sleeve (not your hands).  Avoid shaking hands with others and consider head nods or verbal greetings only.  Avoid touching your eyes, nose, or mouth with unwashed hands.   Avoid close contact with people who are sick.  Avoid places or events with large numbers of people in one location, like concerts or sporting events.  Carefully consider travel plans you have or are making.  If you are planning  any travel outside or inside the [place], visit the [facility] webpage for the latest health notices.  If you have some symptoms but not all symptoms, continue to monitor at home and seek medical attention if your symptoms worsen.  If you are having a medical emergency, call 911.   ADDITIONAL HEALTHCARE OPTIONS FOR PATIENTS  [facility] / e-Visit: [email or web address]         [facility]: [phone]  [facility]: [phone]                    [facility]: [phone]

## 2018-08-16 NOTE — ED Provider Notes (Signed)
[facility] EMERGENCY DEPARTMENT Provider Note   CSN: [id number] Arrival date & time: 08/16/18  1637    History   Chief Complaint Chief Complaint  Patient presents with  . Nasal Congestion    HPI April Dean is a 28 y.o. female with no significant past medical history who presents for 2 days of nausea, lightheadedness and rhinorrhea.  Patient states she has been able to eat and drink without any difficulty but states will intermittently feel nauseous.  She has not had any vomiting.  Patient states that she is also had some rhinorrhea but has not noticed much congestion.  She reports occasionally she will have a cough and when she has this cough, she feels soreness in her chest.  She also reports some soreness everywhere.  Patient denies any chest pain, difficulty breathing.  She denies any history of asthma.  She is not a current smoker.  Patient states she has not had any dysuria, hematuria, abdominal pain.  Patient denies any recent sick contacts, known exposures to COVID-19, recent travel.     The history is provided by the patient.    History reviewed. No pertinent past medical history.  There are no active problems to display for this patient.   Past Surgical History:  Procedure Laterality Date  . ANKLE SURGERY Right      OB History   No obstetric history on file.      Home Medications    Prior to Admission medications   Medication Sig Start Date End Date Taking? Authorizing Provider  diclofenac sodium (VOLTAREN) 1 % GEL Apply 2 g topically 4 (four) times daily. 11/15/17   Gwyneth Sprout, MD  ibuprofen (ADVIL,MOTRIN) 600 MG tablet Take 1 tablet (600 mg total) by mouth every 6 (six) hours as needed. 06/14/16   Audry Pili, PA-C  naproxen (NAPROSYN) 500 MG tablet Take 1 tablet (500 mg total) by mouth 2 (two) times daily. 02/17/18   Dietrich Pates, PA-C    Family History No family history on file.  Social History Social History   Tobacco Use  .  Smoking status: Never Smoker  . Smokeless tobacco: Never Used  Substance Use Topics  . Alcohol use: No  . Drug use: No     Allergies   Robitussin 12 hour cough [dextromethorphan polistirex er]   Review of Systems Review of Systems  Constitutional: Negative for fever.  HENT: Positive for rhinorrhea. Negative for congestion.   Respiratory: Positive for cough. Negative for shortness of breath.   Cardiovascular: Negative for chest pain.  Gastrointestinal: Positive for nausea. Negative for abdominal pain and vomiting.  Genitourinary: Negative for dysuria and hematuria.  Neurological: Negative for headaches.  All other systems reviewed and are negative.    Physical Exam Updated Vital Signs BP 109/71 (BP Location: Left Arm)   Pulse 86   Temp 98.3 F (36.8 C) (Oral)   Resp 18   Ht  (1.6 m)   Wt 83.9 kg   LMP 07/04/2018 Comment: IUD  SpO2 98%   BMI 32.77 kg/m   Physical Exam Vitals signs and nursing note reviewed.  Constitutional:      Appearance: She is well-developed.  HENT:     Head: Normocephalic and atraumatic.     Nose: Rhinorrhea present.     Mouth/Throat:     Pharynx: Oropharynx is clear. Uvula midline.     Comments: Airway is patent, phonation is intact. Uvula is midline.  No trismus. Eyes:     General: No  scleral icterus.       Right eye: No discharge.        Left eye: No discharge.     Conjunctiva/sclera: Conjunctivae normal.  Pulmonary:     Effort: Pulmonary effort is normal.     Breath sounds: Normal breath sounds.     Comments: Lungs clear to auscultation bilaterally.  Symmetric chest rise.  No wheezing, rales, rhonchi. Abdominal:     Tenderness: There is no abdominal tenderness.     Comments: Abdomen is soft, non-distended, non-tender. No rigidity, No guarding. No peritoneal signs.  Skin:    General: Skin is warm and dry.  Neurological:     Mental Status: She is alert.  Psychiatric:        Speech: Speech normal.        Behavior: Behavior  normal.      ED Treatments / Results  Labs (all labs ordered are listed, but only abnormal results are displayed) Labs Reviewed  PREGNANCY, URINE    EKG None  Radiology Dg Chest 2 View  Result Date: 08/16/2018 CLINICAL DATA:  Upper chest pain with numbness and tingling down the left arm, nausea, and runny nose X 2 days. EXAM: CHEST - 2 VIEW COMPARISON:  06/02/2018 FINDINGS: The heart size and mediastinal contours are within normal limits. Both lungs are clear. The visualized skeletal structures are unremarkable. IMPRESSION: No active cardiopulmonary disease. Electronically Signed   By: Gaylyn Rong M.D.   On: 08/16/2018 17:59    Procedures Procedures (including critical care time)  Medications Ordered in ED Medications  ondansetron (ZOFRAN-ODT) disintegrating tablet 4 mg (4 mg Oral Given 08/16/18 1720)     Initial Impression / Assessment and Plan / ED Course  I have reviewed the triage vital signs and the nursing notes.  Pertinent labs & imaging results that were available during my care of the patient were reviewed by me and considered in my medical decision making (see chart for details).        28 year old female who presents for evaluation of rhinorrhea, nausea, cough for last 2 days.  Also reports some associated chest soreness, body aches.  No chest pain, difficulty breathing.  No vomiting, abdominal pain.  No fevers.  No recent travel, sick contacts. Patient is afebrile, non-toxic appearing, sitting comfortably on examination table. Vital signs reviewed and stable.  On exam, lungs clear to auscultation.  Abdomen benign.  We will plan to check chest x-ray, pregnancy.  X-ray reviewed.  Negative for any acute infectious etiology.  Urine pregnancy negative.  Discussed results with patient.  She has been able to tolerate p.o. here in the ED.  Vital signs are stable.  Plan to treat as viral URI. April Dean was evaluated in Emergency Department on 08/16/2018 for the  symptoms described in the history of present illness. She was evaluated in the context of the global COVID-19 pandemic, which necessitated consideration that the patient might be at risk for infection with the SARS-CoV-2 virus that causes COVID-19. Institutional protocols and algorithms that pertain to the evaluation of patients at risk for COVID-19 are in a state of rapid change based on information released by regulatory bodies including the CDC and federal and state organizations. These policies and algorithms were followed during the patient's care in the ED. at this time, patient does not meet any criteria for COVID-19 testing. At this time, patient exhibits no emergent life-threatening condition that require further evaluation in ED or admission. Patient had ample opportunity for questions and discussion.  All patient's questions were answered with full understanding. Strict return precautions discussed. Patient expresses understanding and agreement to plan.  Portions of this note were generated with Scientist, clinical (histocompatibility and immunogenetics). Dictation errors may occur despite best attempts at proofreading.   Final Clinical Impressions(s) / ED Diagnoses   Final diagnoses:  Viral URI with cough    ED Discharge Orders    None       Rosana Hoes 08/16/18 2105    Azalia Bilis, MD 08/16/18 2207

## 2018-11-30 ENCOUNTER — Emergency Department (HOSPITAL_BASED_OUTPATIENT_CLINIC_OR_DEPARTMENT_OTHER)
Admission: EM | Admit: 2018-11-30 | Discharge: 2018-11-30 | Disposition: A | Discharge status: Home / Self Care | Payer: BC Managed Care – PPO | Attending: Emergency Medicine | Admitting: Emergency Medicine

## 2018-11-30 ENCOUNTER — Encounter (HOSPITAL_BASED_OUTPATIENT_CLINIC_OR_DEPARTMENT_OTHER): Payer: Self-pay | Admitting: Emergency Medicine

## 2018-11-30 ENCOUNTER — Other Ambulatory Visit: Payer: Self-pay

## 2018-11-30 DIAGNOSIS — L739 Follicular disorder, unspecified: Secondary | ICD-10-CM | POA: Diagnosis present

## 2018-11-30 MED ORDER — DOXYCYCLINE HYCLATE 100 MG PO CAPS: 100.0000 mg | ORAL_CAPSULE | Freq: Two times a day (BID) | ORAL | 0 refills | Status: AC

## 2018-11-30 NOTE — Discharge Instructions (Signed)
You were seen in the emergency department today with inflammation and possible developing infection in the groin area.  I am starting antibiotics and will ask you to apply warm compress to the area.  These areas should get smaller and less painful.  If they suddenly become more painful or swell to a much larger size they may require reevaluation in the emergency department for possible drainage.

## 2018-11-30 NOTE — ED Triage Notes (Signed)
Pt c/o painful bump to vagina and buttocks x 3 days.

## 2018-11-30 NOTE — ED Provider Notes (Signed)
Emergency Department Provider Note   I have reviewed the triage vital signs and the nursing notes.   HISTORY  Chief Complaint Abscess   HPI April Dean is a 28 y.o. female presents to the emergency department for evaluation of skin irritation and pain in the perineum area.  Symptoms have been ongoing for the past 3 days.  She initially developed a painful bump near her buttocks and then another developed over the top of the pelvis area.  She denies any fever or chills.  No drainage from the area.  No prior history of abscess or I&D.  She has tried cleaning the area with no relief in symptoms.   History reviewed. No pertinent past medical history.  There are no active problems to display for this patient.   Past Surgical History:  Procedure Laterality Date  . ANKLE SURGERY Right     Allergies Robitussin 12 hour cough [dextromethorphan polistirex er]  No family history on file.  Social History Social History   Tobacco Use  . Smoking status: Never Smoker  . Smokeless tobacco: Never Used  Substance Use Topics  . Alcohol use: No  . Drug use: No    Review of Systems  Constitutional: No fever/chills Eyes: No visual changes. ENT: No sore throat. Cardiovascular: Denies chest pain. Respiratory: Denies shortness of breath. Gastrointestinal: No abdominal pain.  No nausea, no vomiting.  No diarrhea.  No constipation. Genitourinary: Negative for dysuria. Musculoskeletal: Negative for back pain. Skin: Painful bumps in groin area.  Neurological: Negative for headaches, focal weakness or numbness.  10-point ROS otherwise negative.  ____________________________________________   PHYSICAL EXAM:  VITAL SIGNS: ED Triage Vitals  Enc Vitals Group     BP 11/30/18 1102 123/75     Pulse Rate 11/30/18 1102 71     Resp 11/30/18 1102 16     Temp 11/30/18 1102 98.6 F (37 C)     Temp Source 11/30/18 1102 Oral     SpO2 11/30/18 1102 100 %     Weight 11/30/18 1100 180 lb  (81.6 kg)     Height 11/30/18 1100 5\' 3"  (1.6 m)   Constitutional: Alert and oriented. Well appearing and in no acute distress. Eyes: Conjunctivae are normal. Head: Atraumatic. Nose: No congestion/rhinnorhea. Mouth/Throat: Mucous membranes are moist.  Neck: No stridor.   Cardiovascular: Normal rate, regular rhythm Respiratory: Normal respiratory effort.   Gastrointestinal: No distention.  Genitourinary: Exam performed with verbal consent and with nurse chaperone present.  Small area over the mons pubis with focal tenderness.  No fluctuance.  The area is small and without surrounding cellulitis.  No drainage.  Secondary area at the base of the right buttock.  No evidence of perirectal abscess.  No labial abscess. Musculoskeletal: No gross deformities of extremities. Neurologic:  Normal speech and language.  Skin:  Skin is warm, dry and intact. No rash noted. Skin exam as above.    ____________________________________________   PROCEDURES  Procedure(s) performed:   Procedures  None ____________________________________________   INITIAL IMPRESSION / ASSESSMENT AND PLAN / ED COURSE  Pertinent labs & imaging results that were available during my care of the patient were reviewed by me and considered in my medical decision making (see chart for details).   Patient with 2, small areas of focal induration.  No evidence of fluctuance or obvious abscess.  Had discussion with the patient regarding management.  Opted for antibiotics, warm compress, Tylenol/Motrin for pain rather than I&D as I suspect that there is not  a significant fluid collection in either location.  Discussed ED return precautions in detail along with need for Gyn follow up.    ____________________________________________  FINAL CLINICAL IMPRESSION(S) / ED DIAGNOSES  Final diagnoses:  Folliculitis    NEW OUTPATIENT MEDICATIONS STARTED DURING THIS VISIT:  Discharge Medication List as of 11/30/2018 12:03 PM     START taking these medications   Details  doxycycline (VIBRAMYCIN) 100 MG capsule Take 1 capsule (100 mg total) by mouth 2 (two) times daily for 7 days., Starting Sat 11/30/2018, Until Sat 12/07/2018, Normal        Note:  This document was prepared using Dragon voice recognition software and may include unintentional dictation errors.  Alona BeneJoshua , MD Emergency Medicine    , Arlyss RepressJoshua G, MD 11/30/18 [phone]

## 2019-03-14 ENCOUNTER — Emergency Department (HOSPITAL_BASED_OUTPATIENT_CLINIC_OR_DEPARTMENT_OTHER)
Admission: EM | Admit: 2019-03-14 | Discharge: 2019-03-14 | Disposition: A | Discharge status: Home / Self Care | Payer: BC Managed Care – PPO | Attending: Emergency Medicine | Admitting: Emergency Medicine

## 2019-03-14 ENCOUNTER — Encounter (HOSPITAL_BASED_OUTPATIENT_CLINIC_OR_DEPARTMENT_OTHER): Payer: Self-pay

## 2019-03-14 ENCOUNTER — Emergency Department (HOSPITAL_BASED_OUTPATIENT_CLINIC_OR_DEPARTMENT_OTHER): Payer: BC Managed Care – PPO

## 2019-03-14 ENCOUNTER — Other Ambulatory Visit: Payer: Self-pay

## 2019-03-14 DIAGNOSIS — M7918 Myalgia, other site: Secondary | ICD-10-CM | POA: Insufficient documentation

## 2019-03-14 DIAGNOSIS — Z888 Allergy status to other drugs, medicaments and biological substances status: Secondary | ICD-10-CM | POA: Diagnosis not present

## 2019-03-14 DIAGNOSIS — Z20828 Contact with and (suspected) exposure to other viral communicable diseases: Secondary | ICD-10-CM | POA: Insufficient documentation

## 2019-03-14 DIAGNOSIS — R519 Headache, unspecified: Secondary | ICD-10-CM | POA: Diagnosis not present

## 2019-03-14 DIAGNOSIS — R110 Nausea: Secondary | ICD-10-CM | POA: Diagnosis not present

## 2019-03-14 DIAGNOSIS — Z20822 Contact with and (suspected) exposure to covid-19: Secondary | ICD-10-CM

## 2019-03-14 LAB — CBC
HCT: 38.7 % (ref 36.0–46.0)
Hemoglobin: 12.2 g/dL (ref 12.0–15.0)
MCH: 28 pg (ref 26.0–34.0)
MCHC: 31.5 g/dL (ref 30.0–36.0)
MCV: 89 fL (ref 80.0–100.0)
Platelets: 400 10*3/uL (ref 150–400)
RBC: 4.35 MIL/uL (ref 3.87–5.11)
RDW: 14.1 % (ref 11.5–15.5)
WBC: 6.1 10*3/uL (ref 4.0–10.5)
nRBC: 0 % (ref 0.0–0.2)

## 2019-03-14 LAB — COMPREHENSIVE METABOLIC PANEL
ALT: 18 U/L (ref 0–44)
AST: 22 U/L (ref 15–41)
Albumin: 4.4 g/dL (ref 3.5–5.0)
Alkaline Phosphatase: 69 U/L (ref 38–126)
Anion gap: 7 (ref 5–15)
BUN: 11 mg/dL (ref 6–20)
CO2: 27 mmol/L (ref 22–32)
Calcium: 9.2 mg/dL (ref 8.9–10.3)
Chloride: 105 mmol/L (ref 98–111)
Creatinine, Ser: 0.67 mg/dL (ref 0.44–1.00)
GFR calc Af Amer: >60 mL/min (ref >60)
GFR calc non Af Amer: >60 mL/min (ref >60)
Glucose, Bld: 77 mg/dL (ref 70–99)
Potassium: 3.7 mmol/L (ref 3.5–5.1)
Sodium: 139 mmol/L (ref 135–145)
Total Bilirubin: 0.5 mg/dL (ref 0.3–1.2)
Total Protein: 7.5 g/dL (ref 6.5–8.1)

## 2019-03-14 LAB — URINALYSIS, ROUTINE W REFLEX MICROSCOPIC
Bilirubin Urine: NEGATIVE
Glucose, UA: NEGATIVE mg/dL
Hgb urine dipstick: NEGATIVE
Ketones, ur: 15 mg/dL — AB
Leukocytes,Ua: NEGATIVE
Nitrite: NEGATIVE
Protein, ur: NEGATIVE mg/dL
Specific Gravity, Urine: 1.015 (ref 1.005–1.030)
pH: 7.5 (ref 5.0–8.0)

## 2019-03-14 LAB — LIPASE, BLOOD: Lipase: 33 U/L (ref 11–51)

## 2019-03-14 LAB — PREGNANCY, URINE: Preg Test, Ur: NEGATIVE

## 2019-03-14 MED ORDER — ONDANSETRON 4 MG PO TBDP: 4.0000 mg | ORAL_TABLET | Freq: Three times a day (TID) | ORAL | 1 refills | Status: DC | PRN

## 2019-03-14 NOTE — ED Provider Notes (Signed)
[facility] EMERGENCY DEPARTMENT Provider Note   CSN: [id number] Arrival date & time: 03/14/19  1528     History   Chief Complaint Chief Complaint  Patient presents with   Generalized Body Aches    HPI April Dean is a 28 y.o. female.     Patient presenting with a 2-week history of body aches headache runny nose nausea intermittently no particular cough.  No diarrhea.  Occasional numbness to the right forearm and hand.  And occasional numbness to the left hip area.  They are usually present first thing in the morning and then resolved.  Some mild intermittent abdominal pain.  No discomfort with urination.     History reviewed. No pertinent past medical history.  There are no active problems to display for this patient.   Past Surgical History:  Procedure Laterality Date   ANKLE SURGERY Right      OB History   No obstetric history on file.      Home Medications    Prior to Admission medications   Medication Sig Start Date End Date Taking? Authorizing Provider  diclofenac sodium (VOLTAREN) 1 % GEL Apply 2 g topically 4 (four) times daily. 11/15/17   Gwyneth Sprout, MD  ibuprofen (ADVIL,MOTRIN) 600 MG tablet Take 1 tablet (600 mg total) by mouth every 6 (six) hours as needed. 06/14/16   Audry Pili, PA-C  naproxen (NAPROSYN) 500 MG tablet Take 1 tablet (500 mg total) by mouth 2 (two) times daily. 02/17/18   Khatri, Hina, PA-C  ondansetron (ZOFRAN ODT) 4 MG disintegrating tablet Take 1 tablet (4 mg total) by mouth every 8 (eight) hours as needed. 03/14/19   Vanetta Mulders, MD    Family History No family history on file.  Social History Social History   Tobacco Use   Smoking status: Never Smoker   Smokeless tobacco: Never Used  Substance Use Topics   Alcohol use: No   Drug use: No     Allergies   Robitussin 12 hour cough [dextromethorphan polistirex er]   Review of Systems Review of Systems  Constitutional: Positive for fatigue.    HENT: Positive for congestion.   Eyes: Negative for redness.  Respiratory: Negative for cough and shortness of breath.   Gastrointestinal: Positive for abdominal pain and nausea.  Genitourinary: Negative for dysuria and hematuria.  Musculoskeletal: Positive for myalgias.  Skin: Negative for rash.  Neurological: Positive for numbness and headaches. Negative for dizziness, syncope, facial asymmetry, speech difficulty and weakness.     Physical Exam Updated Vital Signs BP 111/79 (BP Location: Right Arm)    Pulse 91    Temp 98.4 F (36.9 C) (Oral)    Resp 14    Ht 1.6 m (5\' 3" )    SpO2 99%    BMI 31.89 kg/m   Physical Exam Vitals signs and nursing note reviewed.  Constitutional:      General: She is not in acute distress.    Appearance: Normal appearance. She is well-developed.  HENT:     Head: Normocephalic and atraumatic.  Eyes:     Extraocular Movements: Extraocular movements intact.     Conjunctiva/sclera: Conjunctivae normal.     Pupils: Pupils are equal, round, and reactive to light.  Neck:     Musculoskeletal: Normal range of motion and neck supple.  Cardiovascular:     Rate and Rhythm: Normal rate and regular rhythm.     Heart sounds: No murmur.  Pulmonary:     Effort: Pulmonary effort is normal.  No respiratory distress.     Breath sounds: Normal breath sounds.  Abdominal:     Palpations: Abdomen is soft.     Tenderness: There is no abdominal tenderness.  Musculoskeletal: Normal range of motion.        General: No swelling.  Skin:    General: Skin is warm and dry.  Neurological:     General: No focal deficit present.     Mental Status: She is alert and oriented to person, place, and time.     Cranial Nerves: No cranial nerve deficit.     Sensory: No sensory deficit.     Motor: No weakness.      ED Treatments / Results  Labs (all labs ordered are listed, but only abnormal results are displayed) Labs Reviewed  URINALYSIS, ROUTINE W REFLEX MICROSCOPIC -  Abnormal; Notable for the following components:      Result Value   Ketones, ur 15 (*)    All other components within normal limits  SARS CORONAVIRUS 2 (TAT 6-24 HRS)  COMPREHENSIVE METABOLIC PANEL  CBC  LIPASE, BLOOD  PREGNANCY, URINE    EKG None  Radiology Dg Chest Port 1 View  Result Date: 03/14/2019 CLINICAL DATA:  Generalized pain and headache.  Nausea. EXAM: PORTABLE CHEST 1 VIEW COMPARISON:  August 16, 2018 FINDINGS: Lungs are clear. Heart size and pulmonary vascularity are normal. No adenopathy. No pneumothorax. No bone lesions. IMPRESSION: No edema or consolidation.  No evident adenopathy. Electronically Signed   By: Lowella Grip III M.D.   On: 03/14/2019 17:10    Procedures Procedures (including critical care time)  Medications Ordered in ED Medications - No data to display   Initial Impression / Assessment and Plan / ED Course  I have reviewed the triage vital signs and the nursing notes.  Pertinent labs & imaging results that were available during my care of the patient were reviewed by me and considered in my medical decision making (see chart for details).        Patient nontoxic no acute distress.  With symptoms could be consistent with COVID-19.  Patient with some concern about that.  Covid testing sent outpatient basis.  Patient will self quarantine until results are back.  Work note provided.  Basic labs including urinalysis without any significant abnormalities here today.  Pregnancy test negative as well.  Chest x-ray negative.  We will treat patient nausea with Zofran.  Tylenol for the body aches.  Outpatient COVID-19 testing pending.  Final Clinical Impressions(s) / ED Diagnoses   Final diagnoses:  Suspected COVID-19 virus infection    ED Discharge Orders         Ordered    ondansetron (ZOFRAN ODT) 4 MG disintegrating tablet  Every 8 hours PRN     03/14/19 2017           Fredia Sorrow, MD 03/14/19 2026

## 2019-03-14 NOTE — ED Notes (Signed)
Portable CXR complete.

## 2019-03-14 NOTE — ED Triage Notes (Addendum)
C/o body aches, HA, runny nose, nausea x 2 weeks-pt also added she has "left side feels numb" x 2-3 days, sweats when she has BM and buttock pain-NAD-steady gait

## 2019-03-14 NOTE — Discharge Instructions (Signed)
Work note provided to be out of work through Tuesday.  Outpatient Covid testing has been done should have results in 24 to 48 hours.  Self isolate until that time.  If positive you need to continue to self isolate.  Recommend Tylenol for the body aches.  Prescription for Zofran provided for the nausea.

## 2019-03-15 LAB — SARS CORONAVIRUS 2 (TAT 6-24 HRS): SARS Coronavirus 2: NEGATIVE

## 2019-07-02 ENCOUNTER — Other Ambulatory Visit: Payer: Self-pay

## 2019-07-02 ENCOUNTER — Encounter (HOSPITAL_BASED_OUTPATIENT_CLINIC_OR_DEPARTMENT_OTHER): Payer: Self-pay | Admitting: *Deleted

## 2019-07-02 ENCOUNTER — Emergency Department (HOSPITAL_BASED_OUTPATIENT_CLINIC_OR_DEPARTMENT_OTHER)
Admission: EM | Admit: 2019-07-02 | Discharge: 2019-07-02 | Disposition: A | Discharge status: Home / Self Care | Payer: BC Managed Care – PPO | Attending: Emergency Medicine | Admitting: Emergency Medicine

## 2019-07-02 DIAGNOSIS — R112 Nausea with vomiting, unspecified: Secondary | ICD-10-CM

## 2019-07-02 DIAGNOSIS — R197 Diarrhea, unspecified: Secondary | ICD-10-CM | POA: Diagnosis not present

## 2019-07-02 DIAGNOSIS — E876 Hypokalemia: Secondary | ICD-10-CM | POA: Diagnosis not present

## 2019-07-02 DIAGNOSIS — B349 Viral infection, unspecified: Secondary | ICD-10-CM

## 2019-07-02 DIAGNOSIS — N760 Acute vaginitis: Secondary | ICD-10-CM | POA: Diagnosis not present

## 2019-07-02 DIAGNOSIS — R1110 Vomiting, unspecified: Secondary | ICD-10-CM | POA: Diagnosis present

## 2019-07-02 DIAGNOSIS — Z20822 Contact with and (suspected) exposure to covid-19: Secondary | ICD-10-CM | POA: Diagnosis not present

## 2019-07-02 DIAGNOSIS — B9689 Other specified bacterial agents as the cause of diseases classified elsewhere: Secondary | ICD-10-CM

## 2019-07-02 LAB — URINALYSIS, ROUTINE W REFLEX MICROSCOPIC
Bilirubin Urine: NEGATIVE
Glucose, UA: NEGATIVE mg/dL
Ketones, ur: NEGATIVE mg/dL
Leukocytes,Ua: NEGATIVE
Nitrite: NEGATIVE
Protein, ur: NEGATIVE mg/dL
Specific Gravity, Urine: 1.025 (ref 1.005–1.030)
pH: 6 (ref 5.0–8.0)

## 2019-07-02 LAB — URINALYSIS, MICROSCOPIC (REFLEX): WBC, UA: NONE SEEN WBC/hpf (ref 0–5)

## 2019-07-02 LAB — PREGNANCY, URINE: Preg Test, Ur: NEGATIVE

## 2019-07-02 LAB — CBC WITH DIFFERENTIAL/PLATELET
Abs Immature Granulocytes: 0.02 10*3/uL (ref 0.00–0.07)
Basophils Absolute: 0 10*3/uL (ref 0.0–0.1)
Basophils Relative: 0 %
Eosinophils Absolute: 0 10*3/uL (ref 0.0–0.5)
Eosinophils Relative: 0 %
HCT: 41 % (ref 36.0–46.0)
Hemoglobin: 13.1 g/dL (ref 12.0–15.0)
Immature Granulocytes: 0 %
Lymphocytes Relative: 11 %
Lymphs Abs: 0.9 10*3/uL (ref 0.7–4.0)
MCH: 28.4 pg (ref 26.0–34.0)
MCHC: 32 g/dL (ref 30.0–36.0)
MCV: 88.9 fL (ref 80.0–100.0)
Monocytes Absolute: 0.6 10*3/uL (ref 0.1–1.0)
Monocytes Relative: 7 %
Neutro Abs: 6.5 10*3/uL (ref 1.7–7.7)
Neutrophils Relative %: 82 %
Platelets: 444 10*3/uL — ABNORMAL HIGH (ref 150–400)
RBC: 4.61 MIL/uL (ref 3.87–5.11)
RDW: 14.1 % (ref 11.5–15.5)
WBC: 8 10*3/uL (ref 4.0–10.5)
nRBC: 0 % (ref 0.0–0.2)

## 2019-07-02 LAB — COMPREHENSIVE METABOLIC PANEL
ALT: 19 U/L (ref 0–44)
AST: 21 U/L (ref 15–41)
Albumin: 4.9 g/dL (ref 3.5–5.0)
Alkaline Phosphatase: 78 U/L (ref 38–126)
Anion gap: 8 (ref 5–15)
BUN: 9 mg/dL (ref 6–20)
CO2: 27 mmol/L (ref 22–32)
Calcium: 9.3 mg/dL (ref 8.9–10.3)
Chloride: 102 mmol/L (ref 98–111)
Creatinine, Ser: 0.79 mg/dL (ref 0.44–1.00)
GFR calc Af Amer: >60 mL/min (ref >60)
GFR calc non Af Amer: >60 mL/min (ref >60)
Glucose, Bld: 98 mg/dL (ref 70–99)
Potassium: 3.4 mmol/L — ABNORMAL LOW (ref 3.5–5.1)
Sodium: 137 mmol/L (ref 135–145)
Total Bilirubin: 0.9 mg/dL (ref 0.3–1.2)
Total Protein: 8.5 g/dL — ABNORMAL HIGH (ref 6.5–8.1)

## 2019-07-02 LAB — WET PREP, GENITAL
Sperm: NONE SEEN
Trich, Wet Prep: NONE SEEN
Yeast Wet Prep HPF POC: NONE SEEN

## 2019-07-02 LAB — LIPASE, BLOOD: Lipase: 27 U/L (ref 11–51)

## 2019-07-02 LAB — SARS CORONAVIRUS 2 AG (30 MIN TAT): SARS Coronavirus 2 Ag: NEGATIVE

## 2019-07-02 LAB — HIV ANTIBODY (ROUTINE TESTING W REFLEX): HIV Screen 4th Generation wRfx: NONREACTIVE

## 2019-07-02 MED ORDER — ONDANSETRON HCL 4 MG PO TABS: 4.0000 mg | ORAL_TABLET | Freq: Four times a day (QID) | ORAL | 0 refills | Status: AC

## 2019-07-02 MED ORDER — SODIUM CHLORIDE 0.9 % IV BOLUS
1000.0000 mL | Freq: Once | INTRAVENOUS | Status: AC
  Administered 2019-07-02: 1000 mL via INTRAVENOUS

## 2019-07-02 MED ORDER — METRONIDAZOLE 500 MG PO TABS: 500.0000 mg | ORAL_TABLET | Freq: Two times a day (BID) | ORAL | 0 refills | Status: DC

## 2019-07-02 MED ORDER — ONDANSETRON HCL 4 MG/2ML IJ SOLN
4.0000 mg | Freq: Once | INTRAMUSCULAR | Status: AC
  Administered 2019-07-02: 19:00:00 4 mg via INTRAVENOUS
  Filled 2019-07-02: qty 2

## 2019-07-02 MED ORDER — POTASSIUM CHLORIDE ER 10 MEQ PO TBCR: 10.0000 meq | EXTENDED_RELEASE_TABLET | Freq: Every day | ORAL | 0 refills | Status: AC

## 2019-07-02 NOTE — ED Notes (Signed)
Pt ambulated to RR with steady gait, NAD.  

## 2019-07-02 NOTE — Discharge Instructions (Signed)
Please pick up medication to cover for bacterial vaginosis. Follow up with your OBGYN regarding this. We have tested you for gonorrhea, chlamydia, HIV, and syphilis. You will get a call in 2-3 days IF you test positive. You will need to seek treatment at that time. You will need to let all partners know at that time that they will need to be tested and treated as well.   We have swabbed you for COVID 19 today - stay home and self isolate until you receive your results. If positive you will need to stay home for 14 days and cannot resume normal daily activity until 07/17/2019.   I have prescribed medication for nausea. Your potassium was mildly decreased as well. Please pick up medication and take as prescribed. You will need to have your potassium level rechecked in 1-2 weeks.

## 2019-07-02 NOTE — ED Notes (Signed)
ED Provider at bedside. 

## 2019-07-02 NOTE — ED Triage Notes (Signed)
Pt c/o n/v/d , chills body aches , fever chills x 1 day

## 2019-07-02 NOTE — ED Provider Notes (Signed)
[facility] EMERGENCY DEPARTMENT Provider Note   CSN: [id number] Arrival date & time: 07/02/19  1709     History Chief Complaint  Patient presents with  . Emesis    April Dean is a 29 y.o. female who presents to the ED today complaining of sudden onset N/V/watery diarrhea that began this morning around lunchtime. Pt reports that she woke up this morning with a slight headache as well as chills but went to work regardless. She had [employer] route 30 minutes later started having nausea and vomiting.  Patient states she had about 3 episodes of nonbloody nonbilious emesis today.  Denies fever, cough, chest pain, shortness of breath, neck stiffness, vision changes, abdominal pain, melena, blood in stool, any other associated symptoms.  No recent foreign travel.  Recent COVID-19 positive exposure.  She is also complaining of some intermittent vaginal discharge for the past 2 weeks.  She is sexually active with a female partner.  She is denying risk of pregnancy as she has not intercourse with a female in over a year.  The history is provided by the patient.       History reviewed. No pertinent past medical history.  There are no problems to display for this patient.   Past Surgical History:  Procedure Laterality Date  . ANKLE SURGERY Right      OB History   No obstetric history on file.     No family history on file.  Social History   Tobacco Use  . Smoking status: Never Smoker  . Smokeless tobacco: Never Used  Substance Use Topics  . Alcohol use: No  . Drug use: No    Home Medications Prior to Admission medications   Medication Sig Start Date End Date Taking? Authorizing Provider  diclofenac sodium (VOLTAREN) 1 % GEL Apply 2 g topically 4 (four) times daily. 11/15/17   Gwyneth Sprout, MD  ibuprofen (ADVIL,MOTRIN) 600 MG tablet Take 1 tablet (600 mg total) by mouth every 6 (six) hours as needed. 06/14/16   Audry Pili, PA-C  metroNIDAZOLE (FLAGYL) 500  MG tablet Take 1 tablet (500 mg total) by mouth 2 (two) times daily. 07/02/19   Hyman Hopes, Marlin Jarrard, PA-C  naproxen (NAPROSYN) 500 MG tablet Take 1 tablet (500 mg total) by mouth 2 (two) times daily. 02/17/18   Khatri, Hina, PA-C  ondansetron (ZOFRAN ODT) 4 MG disintegrating tablet Take 1 tablet (4 mg total) by mouth every 8 (eight) hours as needed. 03/14/19   Vanetta Mulders, MD  ondansetron (ZOFRAN) 4 MG tablet Take 1 tablet (4 mg total) by mouth every 6 (six) hours. 07/02/19   Hyman Hopes, Adama Ferber, PA-C  potassium chloride (KLOR-CON) 10 MEQ tablet Take 1 tablet (10 mEq total) by mouth daily for 5 days. 07/02/19 07/07/19  Tanda Rockers, PA-C    Allergies    Robitussin 12 hour cough [dextromethorphan polistirex er]  Review of Systems   Review of Systems  Constitutional: Positive for chills and fatigue. Negative for fever.  Eyes: Negative for visual disturbance.  Respiratory: Negative for cough and shortness of breath.   Cardiovascular: Negative for chest pain.  Gastrointestinal: Positive for diarrhea, nausea and vomiting. Negative for abdominal pain.  Genitourinary: Positive for vaginal discharge. Negative for difficulty urinating, dysuria, pelvic pain and vaginal pain.  Musculoskeletal: Negative for myalgias, neck pain and neck stiffness.  Skin: Negative for rash.  Neurological: Positive for headaches.  All other systems reviewed and are negative.   Physical Exam Updated Vital Signs BP 137/90 (BP Location: Right Arm)  Pulse 77   Temp 98.8 F (37.1 C) (Oral)   Resp 18   Ht 5\' 3"  (1.6 m)   Wt 83.5 kg   SpO2 100%   BMI 32.59 kg/m   Physical Exam Vitals and nursing note reviewed.  Constitutional:      Appearance: She is not ill-appearing or diaphoretic.  HENT:     Head: Normocephalic and atraumatic.  Eyes:     Extraocular Movements: Extraocular movements intact.     Conjunctiva/sclera: Conjunctivae normal.     Pupils: Pupils are equal, round, and reactive to light.  Neck:      Meningeal: Brudzinski's sign and Kernig's sign absent.  Cardiovascular:     Rate and Rhythm: Normal rate and regular rhythm.     Pulses: Normal pulses.  Pulmonary:     Effort: Pulmonary effort is normal.     Breath sounds: Normal breath sounds. No wheezing, rhonchi or rales.  Abdominal:     Palpations: Abdomen is soft.     Tenderness: There is no abdominal tenderness. There is no right CVA tenderness, left CVA tenderness, guarding or rebound.  Genitourinary:    Comments: Chaperone present for exam. No rashes, lesions, or tenderness to external genitalia. No erythema, injury, or tenderness to vaginal mucosa. White vaginal discharge to vault. No adnexal masses, tenderness, or fullness. No CMT, cervical friability, or discharge from cervical os. Cervical os is closed. Uterus non-deviated, mobile, nonTTP, and without enlargement.  Musculoskeletal:     Cervical back: Normal range of motion and neck supple.  Skin:    General: Skin is warm and dry.  Neurological:     Mental Status: She is alert.     Comments: CN 3-12 grossly intact A&O x4 GCS 15 Sensation and strength intact Gait nonataxic including with tandem walking Coordination with finger-to-nose WNL Neg romberg, neg pronator drift     ED Results / Procedures / Treatments   Labs (all labs ordered are listed, but only abnormal results are displayed) Labs Reviewed  WET PREP, GENITAL - Abnormal; Notable for the following components:      Result Value   Clue Cells Wet Prep HPF POC PRESENT (*)    WBC, Wet Prep HPF POC MANY (*)    All other components within normal limits  URINALYSIS, ROUTINE W REFLEX MICROSCOPIC - Abnormal; Notable for the following components:   Hgb urine dipstick TRACE (*)    All other components within normal limits  COMPREHENSIVE METABOLIC PANEL - Abnormal; Notable for the following components:   Potassium 3.4 (*)    Total Protein 8.5 (*)    All other components within normal limits  CBC WITH  DIFFERENTIAL/PLATELET - Abnormal; Notable for the following components:   Platelets 444 (*)    All other components within normal limits  URINALYSIS, MICROSCOPIC (REFLEX) - Abnormal; Notable for the following components:   Bacteria, UA RARE (*)    All other components within normal limits  SARS CORONAVIRUS 2 AG (30 MIN TAT)  NOVEL CORONAVIRUS, NAA (HOSP ORDER, SEND-OUT TO REF LAB; TAT 18-24 HRS)  PREGNANCY, URINE  LIPASE, BLOOD  RPR  HIV ANTIBODY (ROUTINE TESTING W REFLEX)  GC/CHLAMYDIA PROBE AMP ([place]) NOT AT [facility]    EKG None  Radiology No results found.  Procedures Procedures (including critical care time)  Medications Ordered in ED Medications  sodium chloride 0.9 % bolus 1,000 mL ( Intravenous Stopped 07/02/19 2024)  ondansetron (ZOFRAN) injection 4 mg (4 mg Intravenous Given 07/02/19 1922)    ED  Course  I have reviewed the triage vital signs and the nursing notes.  Pertinent labs & imaging results that were available during my care of the patient were reviewed by me and considered in my medical decision making (see chart for details).  29 year old female who presents the ED with a constellation of symptoms.  For she had nausea, vomiting, watery diarrhea earlier today after eating lunch from a can.  Has also had a headache and chills since this morning.  Does also report she has had vaginal discharge for 2 weeks.  On arrival to the ED patient is afebrile, nontachycardic and nontachypneic.  She has no focal abdominal tenderness on exam.  Doubt acute abdomen at this time.  Will obtain screening labs, IV fluids, Zofran for nausea.  Perform pelvic exam as well.  Swab for Covid.  Pelvic exam with white vaginal discharge.  No pelvic pain, no CMT tenderness on exam.  CBC without leukocytosis. Hgb stable.  CMP with potassium 3.4. Will discharge home with potassium. No other electrolyte abnormalities. No elevation in LFTs.  U/A with trace hgb however 0-5 RBC per HPF.  Lipase  within normal limits.   Wet prep positive for clue cells. Will treat for BV outpatient.   Rapid covid test negative.   Upon reeval pt resting comfortably in bed. Has not vomited since zofran. Will fluid challenge at this time.   Pt tolerated fluid challenge without difficulty.  Charge home with Zofran.  Discharged home with K-Dur, patient advised she will need to have her potassium level rechecked in 1 to 2 weeks.  Discharged home with Flagyl.  Patient advised that she will receive a call in 2 to 3 days if she test positive for any other STDs.  She will need to return to the ED for treatment at that time and let all partners know that she has been tested and treated.  Is to remain at home and self isolate until she receives her COVID-19 test results.  A work note has been provided.  Strict return precautions have been discussed.  Patient is in agreement with plan and stable for discharge home.   This note was prepared using Dragon voice recognition software and may include unintentional dictation errors due to the inherent limitations of voice recognition software.  April Dean was evaluated in Emergency Department on 07/02/2019 for the symptoms described in the history of present illness. She was evaluated in the context of the global COVID-19 pandemic, which necessitated consideration that the patient might be at risk for infection with the SARS-CoV-2 virus that causes COVID-19. Institutional protocols and algorithms that pertain to the evaluation of patients at risk for COVID-19 are in a state of rapid change based on information released by regulatory bodies including the CDC and federal and state organizations. These policies and algorithms were followed during the patient's care in the ED.     MDM Rules/Calculators/A&P                       Final Clinical Impression(s) / ED Diagnoses Final diagnoses:  Nausea vomiting and diarrhea  Viral illness  Person under investigation for COVID-19    Bacterial vaginosis  Hypokalemia    Rx / DC Orders ED Discharge Orders         Ordered    ondansetron (ZOFRAN) 4 MG tablet  Every 6 hours     07/02/19 2100    metroNIDAZOLE (FLAGYL) 500 MG tablet  2 times daily  07/02/19 2100    potassium chloride (KLOR-CON) 10 MEQ tablet  Daily     07/02/19 2102           Discharge Instructions     Please pick up medication to cover for bacterial vaginosis. Follow up with your OBGYN regarding this. We have tested you for gonorrhea, chlamydia, HIV, and syphilis. You will get a call in 2-3 days IF you test positive. You will need to seek treatment at that time. You will need to let all partners know at that time that they will need to be tested and treated as well.   We have swabbed you for COVID 19 today - stay home and self isolate until you receive your results. If positive you will need to stay home for 14 days and cannot resume normal daily activity until 07/17/2019.   I have prescribed medication for nausea. Your potassium was mildly decreased as well. Please pick up medication and take as prescribed. You will need to have your potassium level rechecked in 1-2 weeks.        Eustaquio Maize, PA-C 07/02/19 2105    Tegeler, Gwenyth Allegra, MD 07/03/19 [phone]

## 2019-07-03 LAB — RPR: RPR Ser Ql: NONREACTIVE

## 2019-07-04 LAB — NOVEL CORONAVIRUS, NAA (HOSP ORDER, SEND-OUT TO REF LAB; TAT 18-24 HRS): SARS-CoV-2, NAA: NOT DETECTED

## 2019-07-04 LAB — GC/CHLAMYDIA PROBE AMP (~~LOC~~) NOT AT ARMC
Chlamydia: NEGATIVE
Neisseria Gonorrhea: NEGATIVE

## 2020-02-09 ENCOUNTER — Emergency Department (HOSPITAL_BASED_OUTPATIENT_CLINIC_OR_DEPARTMENT_OTHER)
Admission: EM | Admit: 2020-02-09 | Discharge: 2020-02-10 | Disposition: A | Discharge status: Home / Self Care | Payer: BC Managed Care – PPO | Attending: Emergency Medicine | Admitting: Emergency Medicine

## 2020-02-09 ENCOUNTER — Other Ambulatory Visit: Payer: Self-pay

## 2020-02-09 ENCOUNTER — Encounter (HOSPITAL_BASED_OUTPATIENT_CLINIC_OR_DEPARTMENT_OTHER): Payer: Self-pay | Admitting: Emergency Medicine

## 2020-02-09 DIAGNOSIS — U071 COVID-19: Secondary | ICD-10-CM | POA: Insufficient documentation

## 2020-02-09 DIAGNOSIS — M791 Myalgia, unspecified site: Secondary | ICD-10-CM | POA: Diagnosis present

## 2020-02-09 LAB — SARS CORONAVIRUS 2 BY RT PCR (HOSPITAL ORDER, PERFORMED IN ~~LOC~~ HOSPITAL LAB): SARS Coronavirus 2: POSITIVE — AB

## 2020-02-09 NOTE — ED Triage Notes (Addendum)
Pt is c/o body aches, loss of taste and smell, and some dizziness if she stands for long periods of time  Onset of symptoms was Sept 15th  Pt states she was tested last week and got a call saying she was negative on Saturday  Pt requesting to be retested

## 2020-02-10 ENCOUNTER — Other Ambulatory Visit: Payer: Self-pay | Admitting: Family

## 2020-02-10 ENCOUNTER — Telehealth: Payer: Self-pay | Admitting: Family

## 2020-02-10 ENCOUNTER — Ambulatory Visit (HOSPITAL_COMMUNITY): Payer: BC Managed Care – PPO

## 2020-02-10 DIAGNOSIS — U071 COVID-19: Secondary | ICD-10-CM

## 2020-02-10 DIAGNOSIS — Z6830 Body mass index (BMI) 30.0-30.9, adult: Secondary | ICD-10-CM

## 2020-02-10 NOTE — Telephone Encounter (Signed)
Called to Discuss with patient about Covid symptoms and the use of the monoclonal antibody infusion for those with mild to moderate Covid symptoms and at a high risk of hospitalization.     Pt appears to qualify for this infusion due to co-morbid conditions and/or a member of an at-risk group in accordance with the FDA Emergency Use Authorization.    April Dean was seen in the med Center [facility] emergency department on 02/09/2020 with symptoms including body aches, loss of taste/smell and lightheadedness. Symptoms started on 9/15. Qualifying risk factors include BMI >25.  Spoke with April Dean regarding the risks and benefits of treatment with Regeneron and wishes to proceed with treatment.  Hello April Dean,   You have been scheduled to receive Regeneron (the monoclonal antibody we discussed) on : 02/11/20 at 1:30pm  If you have been tested outside of a [facility] - you MUST bring a copy of your positive test with you the morning of your appointment. You may take a photo of this and upload to your MyChart portal or have the testing facility fax the result to [phone]    The address for the infusion clinic site is:  --GPS address is [address] - the parking is located near [employer] building where you will see  COVID19 Infusion feather banner marking the entrance to parking.   (see photos below)            --Enter into the 2nd entrance where the "wave, flag banner" is at the road. Turn into this 2nd entrance and immediately turn left to park in 1 of the 5 parking spots.   --Please stay in your car and call the desk for assistance inside [phone].   --Average time in department is roughly 2 hours for Regeneron treatment - this includes preparation of the medication, IV start and the required 1 hour monitoring after the infusion.    Should you develop worsening shortness of breath, chest pain or severe breathing problems please do not wait for this  appointment and go to the Emergency room for evaluation and treatment. You will undergo another oxygen screen before your infusion to ensure this is the best treatment option for you. There is a chance that the best decision may be to send you to the Emergency Room for evaluation at the time of your appointment.   The day of your visit you should: Marland Kitchen Get plenty of rest the night before and drink plenty of water . Eat a light meal/snack before coming and take your medications as prescribed  . Wear warm, comfortable clothes with a shirt that can roll-up over the elbow (will need IV start).  . Wear a mask  . Consider bringing some activity to help pass the time  Many commercial insurers are waiving bills related to COVID treatment however some have ranged from $300-640. We are starting to see some insurers send bills to patients later for the administration of the medication - we are learning more information but you may receive a bill after your appointment.  Please contact your insurance agent to discuss prior to your appointment if you would like further details about billing specific to your policy.    The CPT code is [phone] for your reference.   Marcos Eke, NP 02/10/2020 3:01 PM

## 2020-02-10 NOTE — Progress Notes (Signed)
I connected by phone with April Dean on 02/10/2020 at 3:02 PM to discuss the potential use of a new treatment for mild to moderate COVID-19 viral infection in non-hospitalized patients.  This patient is a 29 y.o. female that meets the FDA criteria for Emergency Use Authorization of COVID monoclonal antibody casirivimab/imdevimab.  Has a (+) direct SARS-CoV-2 viral test result  Has mild or moderate COVID-19   Is NOT hospitalized due to COVID-19  Is within 10 days of symptom onset  Has at least one of the high risk factor(s) for progression to severe COVID-19 and/or hospitalization as defined in EUA.  Specific high risk criteria : BMI > 25   I have spoken and communicated the following to the patient or parent/caregiver regarding COVID monoclonal antibody treatment:  1. FDA has authorized the emergency use for the treatment of mild to moderate COVID-19 in adults and pediatric patients with positive results of direct SARS-CoV-2 viral testing who are 72 years of age and older weighing at least 40 kg, and who are at high risk for progressing to severe COVID-19 and/or hospitalization.  2. The significant known and potential risks and benefits of COVID monoclonal antibody, and the extent to which such potential risks and benefits are unknown.  3. Information on available alternative treatments and the risks and benefits of those alternatives, including clinical trials.  4. Patients treated with COVID monoclonal antibody should continue to self-isolate and use infection control measures (e.g., wear mask, isolate, social distance, avoid sharing personal items, clean and disinfect "high touch" surfaces, and frequent handwashing) according to CDC guidelines.   5. The patient or parent/caregiver has the option to accept or refuse COVID monoclonal antibody treatment.  After reviewing this information with the patient, The patient agreed to proceed with receiving casirivimab\imdevimab infusion and  will be provided a copy of the Fact sheet prior to receiving the infusion.   Jeanine Luz, NP 02/10/2020 3:02 PM

## 2020-02-10 NOTE — ED Provider Notes (Signed)
[facility] Provider Note: April Dell, MD, FACEP  CSN: [id number] MRN: [id number] ARRIVAL: 02/09/20 at 2110 ROOM: [facility]07/[facility]07   CHIEF COMPLAINT  Covid Symptoms   HISTORY OF PRESENT ILLNESS  02/10/20 12:29 AM April Dean is a 29 y.o. female who developed Covid-like symptoms on September 15.  Specifically she has had body aches, loss of taste and smell and lightheadedness with standing.  She has not had shortness of breath or fever.  She rates her body aches as a 9 out of 10 but otherwise her symptoms are not severe.  She was tested negative for Covid last week.   History reviewed. No pertinent past medical history.  Past Surgical History:  Procedure Laterality Date  . ANKLE SURGERY Right     Family History  Problem Relation Age of Onset  . Diabetes Mother   . Hypertension Mother   . Diabetes Other   . Hypertension Other     Social History   Tobacco Use  . Smoking status: Never Smoker  . Smokeless tobacco: Never Used  Vaping Use  . Vaping Use: Never used  Substance Use Topics  . Alcohol use: No  . Drug use: No    Prior to Admission medications   Medication Sig Start Date End Date Taking? Authorizing Provider  diclofenac sodium (VOLTAREN) 1 % GEL Apply 2 g topically 4 (four) times daily. 11/15/17   Gwyneth Sprout, MD  ondansetron (ZOFRAN) 4 MG tablet Take 1 tablet (4 mg total) by mouth every 6 (six) hours. 07/02/19   Hyman Hopes, Margaux, PA-C  potassium chloride (KLOR-CON) 10 MEQ tablet Take 1 tablet (10 mEq total) by mouth daily for 5 days. 07/02/19 07/07/19  Tanda Rockers, PA-C    Allergies Robitussin 12 hour cough [dextromethorphan polistirex er]   REVIEW OF SYSTEMS  Negative except as noted here or in the History of Present Illness.   PHYSICAL EXAMINATION  Initial Vital Signs Blood pressure 118/86, pulse 90, temperature 98.4 F (36.9 C), temperature source Oral, resp. rate 16, height 5\' 3"  (1.6 m), weight 77.1 kg, last menstrual period  02/02/2020, SpO2 100 %.  Examination General: Well-developed, well-nourished female in no acute distress; appearance consistent with age of record HENT: normocephalic; atraumatic Eyes: pupils equal, round and reactive to light; extraocular muscles intact Neck: supple Heart: regular rate and rhythm Lungs: clear to auscultation bilaterally Abdomen: soft; nondistended; nontender; bowel sounds present Extremities: No deformity; full range of motion; pulses normal Neurologic: Awake, alert and oriented; motor function intact in all extremities and symmetric; no facial droop Skin: Warm and dry Psychiatric: Normal mood and affect   RESULTS  Summary of this visit's results, reviewed and interpreted by myself:   EKG Interpretation  Date/Time:    Ventricular Rate:    PR Interval:    QRS Duration:   QT Interval:    QTC Calculation:   R Axis:     Text Interpretation:        Laboratory Studies: Results for orders placed or performed during the hospital encounter of 02/09/20 (from the past 24 hour(s))  SARS Coronavirus 2 by RT PCR (hospital order, performed in [facility] hospital lab) Nasopharyngeal Nasopharyngeal Swab     Status: Abnormal   Collection Time: 02/09/20  9:13 PM   Specimen: Nasopharyngeal Swab  Result Value Ref Range   SARS Coronavirus 2 POSITIVE (A) NEGATIVE   Imaging Studies: No results found.  ED COURSE and MDM  Nursing notes, initial and subsequent vitals signs, including pulse oximetry, reviewed and  interpreted by myself.  Vitals:   02/09/20 2114 02/09/20 2116 02/09/20 2329  BP: 128/90  118/86  Pulse: 94  90  Resp: 14  16  Temp: 99.2 F (37.3 C)  98.4 F (36.9 C)  TempSrc: Oral  Oral  SpO2: 97%  100%  Weight:  77.1 kg   Height:  5\' 3"  (1.6 m)    Medications - No data to display  The patient wishes to be discharged.  She was advised to return if symptoms worsen, especially difficulty breathing.  PROCEDURES  Procedures   ED DIAGNOSES      ICD-10-CM   1. COVID-19 virus infection  U07.1        April Fossett, MD 02/10/20 [phone]

## 2020-02-11 ENCOUNTER — Ambulatory Visit (HOSPITAL_COMMUNITY)
Admission: RE | Admit: 2020-02-11 | Discharge: 2020-02-11 | Disposition: A | Discharge status: Home / Self Care | Payer: BC Managed Care – PPO | Source: Ambulatory Visit | Attending: Pulmonary Disease | Admitting: Pulmonary Disease

## 2020-02-11 DIAGNOSIS — U071 COVID-19: Secondary | ICD-10-CM | POA: Insufficient documentation

## 2020-02-11 DIAGNOSIS — Z6830 Body mass index (BMI) 30.0-30.9, adult: Secondary | ICD-10-CM

## 2020-02-11 MED ORDER — ALBUTEROL SULFATE HFA 108 (90 BASE) MCG/ACT IN AERS: 2.0000 | INHALATION_SPRAY | Freq: Once | RESPIRATORY_TRACT | Status: DC | PRN

## 2020-02-11 MED ORDER — FAMOTIDINE IN NACL 20-0.9 MG/50ML-% IV SOLN: 20.0000 mg | Freq: Once | INTRAVENOUS | Status: DC | PRN

## 2020-02-11 MED ORDER — SODIUM CHLORIDE 0.9 % IV SOLN: INTRAVENOUS | Status: DC | PRN

## 2020-02-11 MED ORDER — METHYLPREDNISOLONE SODIUM SUCC 125 MG IJ SOLR: 125.0000 mg | Freq: Once | INTRAMUSCULAR | Status: DC | PRN

## 2020-02-11 MED ORDER — SODIUM CHLORIDE 0.9 % IV SOLN
1200.0000 mg | Freq: Once | INTRAVENOUS | Status: AC
  Administered 2020-02-11: 1200 mg via INTRAVENOUS

## 2020-02-11 MED ORDER — EPINEPHRINE 0.3 MG/0.3ML IJ SOAJ: 0.3000 mg | Freq: Once | INTRAMUSCULAR | Status: DC | PRN

## 2020-02-11 MED ORDER — DIPHENHYDRAMINE HCL 50 MG/ML IJ SOLN: 50.0000 mg | Freq: Once | INTRAMUSCULAR | Status: DC | PRN

## 2020-02-11 NOTE — Discharge Instructions (Signed)

## 2020-02-11 NOTE — Progress Notes (Signed)
  Diagnosis: COVID-19  Physician:dr patrick wright  Procedure: [facility] Infusion [facility] Med: casirivimab\imdevimab infusion - Provided patient with casirivimab\imdevimab fact sheet for patients, parents and caregivers prior to infusion.  Complications: No immediate complications noted.  Discharge: Discharged home   Ziomara Birenbaum R 02/11/2020   

## 2020-03-10 ENCOUNTER — Ambulatory Visit: Payer: BC Managed Care – PPO

## 2020-03-13 ENCOUNTER — Encounter (HOSPITAL_BASED_OUTPATIENT_CLINIC_OR_DEPARTMENT_OTHER): Payer: Self-pay | Admitting: *Deleted

## 2020-03-13 ENCOUNTER — Other Ambulatory Visit: Payer: Self-pay

## 2020-03-13 ENCOUNTER — Emergency Department (HOSPITAL_BASED_OUTPATIENT_CLINIC_OR_DEPARTMENT_OTHER)
Admission: EM | Admit: 2020-03-13 | Discharge: 2020-03-13 | Disposition: A | Discharge status: Home / Self Care | Payer: BC Managed Care – PPO | Attending: Emergency Medicine | Admitting: Emergency Medicine

## 2020-03-13 DIAGNOSIS — Z8616 Personal history of COVID-19: Secondary | ICD-10-CM | POA: Insufficient documentation

## 2020-03-13 DIAGNOSIS — M545 Low back pain, unspecified: Secondary | ICD-10-CM | POA: Diagnosis present

## 2020-03-13 DIAGNOSIS — X500XXA Overexertion from strenuous movement or load, initial encounter: Secondary | ICD-10-CM | POA: Diagnosis not present

## 2020-03-13 DIAGNOSIS — F419 Anxiety disorder, unspecified: Secondary | ICD-10-CM | POA: Insufficient documentation

## 2020-03-13 DIAGNOSIS — Y9269 Other specified industrial and construction area as the place of occurrence of the external cause: Secondary | ICD-10-CM | POA: Insufficient documentation

## 2020-03-13 LAB — URINALYSIS, ROUTINE W REFLEX MICROSCOPIC
Bilirubin Urine: NEGATIVE
Glucose, UA: NEGATIVE mg/dL
Ketones, ur: NEGATIVE mg/dL
Nitrite: NEGATIVE
Protein, ur: NEGATIVE mg/dL
Specific Gravity, Urine: 1.01 (ref 1.005–1.030)
pH: 6.5 (ref 5.0–8.0)

## 2020-03-13 LAB — PREGNANCY, URINE: Preg Test, Ur: NEGATIVE

## 2020-03-13 LAB — URINALYSIS, MICROSCOPIC (REFLEX)

## 2020-03-13 NOTE — ED Provider Notes (Signed)
[facility] EMERGENCY DEPARTMENT Provider Note   CSN: [id number] Arrival date & time: 03/13/20  1946     History Chief Complaint  Patient presents with  . Back Pain    April Dean is a 29 y.o. female past medical history of Covid diagnosed on 02/09/2020 who presents today for evaluation of multiple complaints.  She reports that today she was at work when she started having pain in her bilateral lower back.  She thinks that this may have been due to lifting heavy things.  She notes that she was quarantined for "a month" after Covid and has only returned to work recently.  She denies any chest pain, cough, or shortness of breath.  She states that her back pain is made worse with movement and sitting.  Made better with being still.  She denies dysuria, fevers, frequency urgency.  No abdominal pain nausea vomiting or diarrhea.  She denies consistent abnormal vaginal discharge.   She additionally reports feeling overwhelmed at work.  She states that she has had a hard time adjusting to being back at work and has been feeling anxious due to work and personal concerns.    HPI     History reviewed. No pertinent past medical history.  There are no problems to display for this patient.   Past Surgical History:  Procedure Laterality Date  . ANKLE SURGERY Right      OB History   No obstetric history on file.     Family History  Problem Relation Age of Onset  . Diabetes Mother   . Hypertension Mother   . Diabetes Other   . Hypertension Other     Social History   Tobacco Use  . Smoking status: Never Smoker  . Smokeless tobacco: Never Used  Vaping Use  . Vaping Use: Never used  Substance Use Topics  . Alcohol use: No  . Drug use: No    Home Medications Prior to Admission medications   Medication Sig Start Date End Date Taking? Authorizing Provider  diclofenac sodium (VOLTAREN) 1 % GEL Apply 2 g topically 4 (four) times daily. 11/15/17   Gwyneth Sprout, MD    ondansetron (ZOFRAN) 4 MG tablet Take 1 tablet (4 mg total) by mouth every 6 (six) hours. 07/02/19   Hyman Hopes, Margaux, PA-C  potassium chloride (KLOR-CON) 10 MEQ tablet Take 1 tablet (10 mEq total) by mouth daily for 5 days. 07/02/19 07/07/19  Tanda Rockers, PA-C    Allergies    Robitussin 12 hour cough [dextromethorphan polistirex er]  Review of Systems   Review of Systems  HENT: Negative for congestion.   Respiratory: Negative for cough and shortness of breath.   Cardiovascular: Negative for chest pain.  Gastrointestinal: Negative for abdominal pain, diarrhea, nausea and vomiting.  Genitourinary: Negative for dysuria, frequency, pelvic pain, urgency, vaginal bleeding, vaginal discharge and vaginal pain.  Musculoskeletal: Positive for back pain. Negative for neck pain.  Skin: Negative for color change.  Psychiatric/Behavioral: Negative for confusion. The patient is nervous/anxious.     Physical Exam Updated Vital Signs BP 120/82 (BP Location: Left Arm)   Pulse 70   Temp 97.8 F (36.6 C) (Oral)   Resp 14   Ht 5\' 3"  (1.6 m)   Wt 77.1 kg   LMP 03/10/2020   SpO2 100%   BMI 30.11 kg/m   Physical Exam Vitals and nursing note reviewed.  Constitutional:      General: She is not in acute distress.    Appearance: She  is well-developed. She is not diaphoretic.  HENT:     Head: Normocephalic and atraumatic.  Eyes:     General: No scleral icterus.       Right eye: No discharge.        Left eye: No discharge.     Conjunctiva/sclera: Conjunctivae normal.  Cardiovascular:     Rate and Rhythm: Normal rate and regular rhythm.     Pulses: Normal pulses.     Heart sounds: Normal heart sounds.  Pulmonary:     Effort: Pulmonary effort is normal. No respiratory distress.     Breath sounds: Normal breath sounds. No stridor.  Abdominal:     General: There is no distension.     Palpations: Abdomen is soft.     Tenderness: There is no abdominal tenderness. There is no right CVA  tenderness, left CVA tenderness or guarding.  Genitourinary:    Comments: Deferred Musculoskeletal:        General: No deformity.     Cervical back: Normal range of motion and neck supple.     Comments: Through lower thoracic and lumbar back there is diffuse tenderness to palpation bilaterally without localized midline tenderness palpation step-offs or deformities.  Palpation of the back diffusely both recreates and exacerbates her reported pain bilaterally.  Skin:    General: Skin is warm and dry.  Neurological:     General: No focal deficit present.     Mental Status: She is alert.     Motor: No abnormal muscle tone.  Psychiatric:     Comments: Flat affect, denies SI HI or AVH.     ED Results / Procedures / Treatments   Labs (all labs ordered are listed, but only abnormal results are displayed) Labs Reviewed  URINALYSIS, ROUTINE W REFLEX MICROSCOPIC - Abnormal; Notable for the following components:      Result Value   APPearance HAZY (*)    Hgb urine dipstick TRACE (*)    Leukocytes,Ua SMALL (*)    All other components within normal limits  URINALYSIS, MICROSCOPIC (REFLEX) - Abnormal; Notable for the following components:   Bacteria, UA FEW (*)    All other components within normal limits  PREGNANCY, URINE    EKG None  Radiology No results found.  Procedures Procedures (including critical care time)  Medications Ordered in ED Medications - No data to display  ED Course  I have reviewed the triage vital signs and the nursing notes.  Pertinent labs & imaging results that were available during my care of the patient were reviewed by me and considered in my medical decision making (see chart for details).    MDM Rules/Calculators/A&P                         Patient is a 29 year old woman who presents today for evaluation of about 3 hours of back pain.  This was enough that she felt like she had to leave work and states that she needs a note stating that she was  seen in the emergency room for this.  On my exam she has easily recreated back pain with palpation of the paraspinal muscles bilaterally primarily in the lower thoracic and lumbar spine without localized midline tenderness palpation, step-offs, or deformities.  No specific CVA tenderness to percussion.  She does not have any urinary symptoms, urine appears contaminated rather than truly infected antibiotics are not indicated.  Pregnancy test is negative.  Doubt pyelonephritis or kidney stone.  No changes to bowel or bladder function.   Recommended conservative care including heat, OTC pain medications.  For her reported anxiety she does not have any SI, HI or AVH.  She feels safe at home.  Recommended outpatient follow-up for counseling/therapy along with PCP follow-up.  Return precautions were discussed with patient who states their understanding.  At the time of discharge patient denied any unaddressed complaints or concerns.  Patient is agreeable for discharge home.  Note: Portions of this report may have been transcribed using voice recognition software. Every effort was made to ensure accuracy; however, inadvertent computerized transcription errors may be present  Final Clinical Impression(s) / ED Diagnoses Final diagnoses:  Acute bilateral low back pain without sciatica  Anxiety    Rx / DC Orders ED Discharge Orders    None       Norman Clay 03/13/20 2350    Pollyann Savoy, MD 03/14/20 1259

## 2020-03-13 NOTE — Discharge Instructions (Addendum)
Please take Ibuprofen (Advil, motrin) and Tylenol (acetaminophen) to relieve your pain.  You may take up to 600 MG (3 pills) of normal strength ibuprofen every 8 hours as needed.  In between doses of ibuprofen you make take tylenol, up to 1,000 mg (two extra strength pills).  Do not take more than 3,000 mg tylenol in a 24 hour period.  Please check all medication labels as many medications such as pain and cold medications may contain tylenol.  Do not drink alcohol while taking these medications.  Do not take other NSAID'S while taking ibuprofen (such as aleve or naproxen).  Please take ibuprofen with food to decrease stomach upset.  Please schedule a appointment to see a counselor or therapist.  If you develop consistent pain with urination, abdominal pain, fevers, shortness of breath, or have any new complaints or concerns please seek additional medical care and evaluation.  I would recommend scheduling a follow-up appointment with your primary care doctor to talk about your concerns and ED visit today.

## 2020-03-13 NOTE — ED Triage Notes (Signed)
Pt reports she was at work today and began having pain in both sides of her back. States she stands a lot and ?pulled muscle. Also reports her stomach was rumbling

## 2021-06-09 ENCOUNTER — Emergency Department (HOSPITAL_BASED_OUTPATIENT_CLINIC_OR_DEPARTMENT_OTHER)
Admission: EM | Admit: 2021-06-09 | Discharge: 2021-06-09 | Disposition: A | Discharge status: Home / Self Care | Payer: BC Managed Care – PPO | Attending: Emergency Medicine | Admitting: Emergency Medicine

## 2021-06-09 ENCOUNTER — Encounter (HOSPITAL_BASED_OUTPATIENT_CLINIC_OR_DEPARTMENT_OTHER): Payer: Self-pay | Admitting: *Deleted

## 2021-06-09 ENCOUNTER — Emergency Department (HOSPITAL_BASED_OUTPATIENT_CLINIC_OR_DEPARTMENT_OTHER): Payer: BC Managed Care – PPO

## 2021-06-09 ENCOUNTER — Other Ambulatory Visit: Payer: Self-pay

## 2021-06-09 DIAGNOSIS — R0981 Nasal congestion: Secondary | ICD-10-CM | POA: Insufficient documentation

## 2021-06-09 DIAGNOSIS — R0602 Shortness of breath: Secondary | ICD-10-CM | POA: Insufficient documentation

## 2021-06-09 DIAGNOSIS — R0789 Other chest pain: Secondary | ICD-10-CM | POA: Insufficient documentation

## 2021-06-09 DIAGNOSIS — R079 Chest pain, unspecified: Secondary | ICD-10-CM

## 2021-06-09 DIAGNOSIS — Z20822 Contact with and (suspected) exposure to covid-19: Secondary | ICD-10-CM | POA: Diagnosis not present

## 2021-06-09 DIAGNOSIS — R5383 Other fatigue: Secondary | ICD-10-CM | POA: Diagnosis not present

## 2021-06-09 DIAGNOSIS — R059 Cough, unspecified: Secondary | ICD-10-CM | POA: Diagnosis not present

## 2021-06-09 HISTORY — DX: Anxiety disorder, unspecified: F41.9

## 2021-06-09 HISTORY — DX: Gastro-esophageal reflux disease without esophagitis: K21.9

## 2021-06-09 LAB — CBC WITH DIFFERENTIAL/PLATELET
Abs Immature Granulocytes: 0.02 10*3/uL (ref 0.00–0.07)
Basophils Absolute: 0.1 10*3/uL (ref 0.0–0.1)
Basophils Relative: 1 %
Eosinophils Absolute: 0 10*3/uL (ref 0.0–0.5)
Eosinophils Relative: 1 %
HCT: 37 % (ref 36.0–46.0)
Hemoglobin: 12.3 g/dL (ref 12.0–15.0)
Immature Granulocytes: 0 %
Lymphocytes Relative: 35 %
Lymphs Abs: 2 10*3/uL (ref 0.7–4.0)
MCH: 28.7 pg (ref 26.0–34.0)
MCHC: 33.2 g/dL (ref 30.0–36.0)
MCV: 86.4 fL (ref 80.0–100.0)
Monocytes Absolute: 0.6 10*3/uL (ref 0.1–1.0)
Monocytes Relative: 10 %
Neutro Abs: 3 10*3/uL (ref 1.7–7.7)
Neutrophils Relative %: 53 %
Platelets: 382 10*3/uL (ref 150–400)
RBC: 4.28 MIL/uL (ref 3.87–5.11)
RDW: 14.6 % (ref 11.5–15.5)
WBC: 5.8 10*3/uL (ref 4.0–10.5)
nRBC: 0 % (ref 0.0–0.2)

## 2021-06-09 LAB — RESP PANEL BY RT-PCR (FLU A&B, COVID) ARPGX2
Influenza A by PCR: NEGATIVE
Influenza B by PCR: NEGATIVE
SARS Coronavirus 2 by RT PCR: NEGATIVE

## 2021-06-09 LAB — BASIC METABOLIC PANEL
Anion gap: 9 (ref 5–15)
BUN: 7 mg/dL (ref 6–20)
CO2: 23 mmol/L (ref 22–32)
Calcium: 9 mg/dL (ref 8.9–10.3)
Chloride: 103 mmol/L (ref 98–111)
Creatinine, Ser: 0.75 mg/dL (ref 0.44–1.00)
GFR, Estimated: >60 mL/min (ref >60)
Glucose, Bld: 90 mg/dL (ref 70–99)
Potassium: 3.6 mmol/L (ref 3.5–5.1)
Sodium: 135 mmol/L (ref 135–145)

## 2021-06-09 LAB — TROPONIN I (HIGH SENSITIVITY): Troponin I (High Sensitivity): <2 ng/L (ref <18)

## 2021-06-09 LAB — PREGNANCY, URINE: Preg Test, Ur: NEGATIVE

## 2021-06-09 MED ORDER — IBUPROFEN 400 MG PO TABS
600.0000 mg | ORAL_TABLET | Freq: Once | ORAL | Status: AC
  Administered 2021-06-09: 600 mg via ORAL
  Filled 2021-06-09: qty 1

## 2021-06-09 MED ORDER — IOHEXOL 300 MG/ML  SOLN
80.0000 mL | Freq: Once | INTRAMUSCULAR | Status: AC | PRN
  Administered 2021-06-09: 80 mL via INTRAVENOUS

## 2021-06-09 NOTE — Discharge Instructions (Addendum)
If you develop recurrent, continued, or worsening chest pain, shortness of breath, fever, vomiting, abdominal or back pain, or any other new/concerning symptoms then return to the ER for evaluation.   Your chest CT shows a epicardial fat pad on the right side of your heart.  This should not be causing any acute symptoms and there is no further follow-up needed.  Follow-up with your primary care physician and general for the chest pain.

## 2021-06-09 NOTE — ED Triage Notes (Signed)
Cough, headache, body aches, and fatigue x 1 week.

## 2021-06-09 NOTE — ED Provider Notes (Signed)
[facility] EMERGENCY DEPARTMENT Provider Note   CSN: [id number] Arrival date & time: 06/09/21  1554     History  Chief Complaint  Patient presents with   URI    April Dean is a 31 y.o. female.  HPI 31 year old female presents with a chief complaint of chest pain, shortness of breath and fatigue.  She has been dealing with cough, congestion, and body aches for about a week.  No fevers but she is had some chills.  However today she is now feeling a tightness in her chest as well as fatigue and worse body aches.  Maybe some shortness of breath.  Has not taken anything for it.  She states she has a history of anxiety but is not sure if she is ever had chest pain like this before.  Home Medications Prior to Admission medications   Medication Sig Start Date End Date Taking? Authorizing Provider  busPIRone (BUSPAR) 5 MG tablet Take by mouth. 08/18/20  Yes [provider]  omeprazole (PRILOSEC) 20 MG capsule Take 1 capsule by mouth daily. 05/30/21 08/28/21 Yes [provider]  diclofenac sodium (VOLTAREN) 1 % GEL Apply 2 g topically 4 (four) times daily. 11/15/17   Gwyneth Sprout, MD  ondansetron (ZOFRAN) 4 MG tablet Take 1 tablet (4 mg total) by mouth every 6 (six) hours. 07/02/19   Hyman Hopes, Margaux, PA-C  potassium chloride (KLOR-CON) 10 MEQ tablet Take 1 tablet (10 mEq total) by mouth daily for 5 days. 07/02/19 07/07/19  Tanda Rockers, PA-C      Allergies    Robitussin 12 hour cough [dextromethorphan polistirex er]    Review of Systems   Review of Systems  Constitutional:  Positive for chills. Negative for fever.  HENT:  Positive for congestion and rhinorrhea.   Respiratory:  Positive for cough and shortness of breath.   Cardiovascular:  Positive for chest pain.  Musculoskeletal:  Positive for myalgias.   Physical Exam Updated Vital Signs BP 123/82 (BP Location: Right Arm)    Pulse 71    Temp 99 F (37.2 C) (Oral)    Resp 18    Ht 5\' 2"  (1.575 m)     Wt 88.3 kg    LMP 05/26/2021    SpO2 100%    BMI 35.59 kg/m  Physical Exam Vitals and nursing note reviewed.  Constitutional:      General: She is not in acute distress.    Appearance: She is well-developed. She is not ill-appearing or diaphoretic.  HENT:     Head: Normocephalic and atraumatic.  Cardiovascular:     Rate and Rhythm: Normal rate and regular rhythm.     Heart sounds: Normal heart sounds.  Pulmonary:     Effort: Pulmonary effort is normal.     Breath sounds: Normal breath sounds.  Chest:     Chest wall: Tenderness (placing the stethoscope on her chest induces pain) present.  Abdominal:     Palpations: Abdomen is soft.     Tenderness: There is no abdominal tenderness.  Skin:    General: Skin is warm and dry.  Neurological:     Mental Status: She is alert.    ED Results / Procedures / Treatments   Labs (all labs ordered are listed, but only abnormal results are displayed) Labs Reviewed  RESP PANEL BY RT-PCR (FLU A&B, COVID) ARPGX2  BASIC METABOLIC PANEL  CBC WITH DIFFERENTIAL/PLATELET  PREGNANCY, URINE  TROPONIN I (HIGH SENSITIVITY)    EKG EKG Interpretation  Date/Time:  Thursday June 09 2021 17:28:10 EST Ventricular Rate:  70 PR Interval:  182 QRS Duration: 90 QT Interval:  391 QTC Calculation: 422 R Axis:   51 Text Interpretation: Sinus rhythm Probable left atrial enlargement no acute ST/T changes No old tracing to compare Confirmed by Pricilla Loveless [phone]) on 06/09/2021 5:39:04 PM  Radiology DG Chest 2 View  Result Date: 06/09/2021 CLINICAL DATA:  chest pain EXAM: CHEST - 2 VIEW COMPARISON:  October 2020, multiple priors FINDINGS: There is a rounded opacity at the right cardiophrenic angle measuring at least 3.5 cm, which appears more conspicuous on today's exam. The heart is normal in size. No pleural effusion. No pneumothorax. No lobar consolidation. No acute osseous abnormality. IMPRESSION: There is a rounded opacity at the right cardiophrenic  angle, which appears more conspicuous on today's exam. Although this may represent a prominent epicardial fat pad, further evaluation with CT of the chest is recommended. These results were called by telephone at the time of interpretation on 06/09/2021 at 4:49 pm to provider Pricilla Loveless , who verbally acknowledged these results. Electronically Signed   By: Olive Bass M.D.   On: 06/09/2021 16:49   CT Chest W Contrast  Result Date: 06/09/2021 CLINICAL DATA:  Evaluate for mass.  Cough and fatigue. EXAM: CT CHEST WITH CONTRAST TECHNIQUE: Multidetector CT imaging of the chest was performed during intravenous contrast administration. RADIATION DOSE REDUCTION: This exam was performed according to the departmental dose-optimization program which includes automated exposure control, adjustment of the mA and/or kV according to patient size and/or use of iterative reconstruction technique. CONTRAST:  29mL OMNIPAQUE IOHEXOL 300 MG/ML  SOLN COMPARISON:  Chest x-ray 06/09/2021. FINDINGS: Cardiovascular: No significant vascular findings. Normal heart size. No pericardial effusion. Mediastinum/Nodes: No enlarged mediastinal, hilar, or axillary lymph nodes. Thyroid gland, trachea, and esophagus demonstrate no significant findings. There is a prominent right epicardial fat pad corresponding to x-ray findings. Lungs/Pleura: Lungs are clear. No pleural effusion or pneumothorax. Upper Abdomen: No acute abnormality. Musculoskeletal: No chest wall abnormality. No acute or significant osseous findings. IMPRESSION: 1. No acute cardiopulmonary process. 2. Prominent right epicardial fat pad corresponds to x-ray findings. Electronically Signed   By: Darliss Cheney M.D.   On: 06/09/2021 19:01    Procedures Procedures    Medications Ordered in ED Medications  ibuprofen (ADVIL) tablet 600 mg (600 mg Oral Given 06/09/21 1836)  iohexol (OMNIPAQUE) 300 MG/ML solution 80 mL (80 mLs Intravenous Contrast Given 06/09/21 1842)    ED  Course/ Medical Decision Making/ A&P                            Patient with nonspecific chest pain that is probably chest wall in etiology based on exam.  However when looking at her chest x-ray and interpreting it, there is an apparent right epicardial fat pad.  However with discussion with radiology, this is bigger than it was before or at least on this technique.  They are recommending CT.  This was performed and shows that it is indeed an epicardial fat pad and no other concerning findings.  She appears to have had some sort of viral illness recently but has a negative COVID and flu test and there is no obvious bacterial infection such as pneumonia.  Labs were obtained to help with CT imaging as well as to screen for myocarditis and her troponin is normal as well as her other labs are all normal on my interpretation.  ECG was also reviewed and interpreted and is benign with no ischemia.  At this point, there is no apparent emergent condition and she can follow-up with her PCP.  We will give return precautions.        Final Clinical Impression(s) / ED Diagnoses Final diagnoses:  Nonspecific chest pain    Rx / DC Orders ED Discharge Orders     None         Pricilla LovelessGoldston, Alexsandra Shontz, MD 06/09/21 2007

## 2022-04-25 ENCOUNTER — Encounter (HOSPITAL_COMMUNITY): Payer: Self-pay | Admitting: *Deleted

## 2022-04-25 ENCOUNTER — Other Ambulatory Visit: Payer: Self-pay

## 2022-04-25 ENCOUNTER — Emergency Department (HOSPITAL_COMMUNITY)
Admission: EM | Admit: 2022-04-25 | Discharge: 2022-04-25 | Disposition: A | Discharge status: Home / Self Care | Payer: BC Managed Care – PPO | Attending: Emergency Medicine | Admitting: Emergency Medicine

## 2022-04-25 ENCOUNTER — Emergency Department (HOSPITAL_COMMUNITY): Payer: BC Managed Care – PPO

## 2022-04-25 DIAGNOSIS — N898 Other specified noninflammatory disorders of vagina: Secondary | ICD-10-CM | POA: Insufficient documentation

## 2022-04-25 DIAGNOSIS — R0789 Other chest pain: Secondary | ICD-10-CM | POA: Insufficient documentation

## 2022-04-25 DIAGNOSIS — F419 Anxiety disorder, unspecified: Secondary | ICD-10-CM | POA: Insufficient documentation

## 2022-04-25 DIAGNOSIS — R519 Headache, unspecified: Secondary | ICD-10-CM | POA: Insufficient documentation

## 2022-04-25 DIAGNOSIS — R42 Dizziness and giddiness: Secondary | ICD-10-CM | POA: Insufficient documentation

## 2022-04-25 DIAGNOSIS — R079 Chest pain, unspecified: Secondary | ICD-10-CM | POA: Diagnosis present

## 2022-04-25 DIAGNOSIS — R0602 Shortness of breath: Secondary | ICD-10-CM | POA: Diagnosis not present

## 2022-04-25 DIAGNOSIS — R110 Nausea: Secondary | ICD-10-CM | POA: Diagnosis not present

## 2022-04-25 LAB — BASIC METABOLIC PANEL
Anion gap: 8 (ref 5–15)
BUN: 6 mg/dL (ref 6–20)
CO2: 26 mmol/L (ref 22–32)
Calcium: 8.7 mg/dL — ABNORMAL LOW (ref 8.9–10.3)
Chloride: 103 mmol/L (ref 98–111)
Creatinine, Ser: 0.75 mg/dL (ref 0.44–1.00)
GFR, Estimated: >60 mL/min (ref >60)
Glucose, Bld: 97 mg/dL (ref 70–99)
Potassium: 3.6 mmol/L (ref 3.5–5.1)
Sodium: 137 mmol/L (ref 135–145)

## 2022-04-25 LAB — CBC
HCT: 35.5 % — ABNORMAL LOW (ref 36.0–46.0)
Hemoglobin: 11.5 g/dL — ABNORMAL LOW (ref 12.0–15.0)
MCH: 28.5 pg (ref 26.0–34.0)
MCHC: 32.4 g/dL (ref 30.0–36.0)
MCV: 88.1 fL (ref 80.0–100.0)
Platelets: 431 10*3/uL — ABNORMAL HIGH (ref 150–400)
RBC: 4.03 MIL/uL (ref 3.87–5.11)
RDW: 14.5 % (ref 11.5–15.5)
WBC: 4 10*3/uL (ref 4.0–10.5)
nRBC: 0 % (ref 0.0–0.2)

## 2022-04-25 LAB — TROPONIN I (HIGH SENSITIVITY)
Troponin I (High Sensitivity): <2 ng/L (ref <18)
Troponin I (High Sensitivity): <2 ng/L (ref <18)

## 2022-04-25 LAB — POC URINE PREG, ED: Preg Test, Ur: NEGATIVE

## 2022-04-25 MED ORDER — HYDROXYZINE HCL 25 MG PO TABS: 25.0000 mg | ORAL_TABLET | Freq: Four times a day (QID) | ORAL | 0 refills | Status: AC | PRN

## 2022-04-25 NOTE — ED Notes (Signed)
Pick[place], MD speaking with the pt in triage, d/t L arm tingling and numbness

## 2022-04-25 NOTE — ED Provider Notes (Signed)
[facility] EMERGENCY DEPARTMENT Provider Note   CSN: [id number] Arrival date & time: 04/25/22  1206     History  Chief Complaint  Patient presents with   Chest Pain    April Dean is a 31 y.o. female.  The history is provided by the patient and medical records. No language interpreter was used.  Chest Pain    April Dean is a 31 y.o. female who presents today with concern for numbness and tingling from the left shoulder radiating down to her fingertips. She states this has been on and off since she woke up this morning. Pt denies significant chest pain, but does endorse increased work of breathing. Pt endorses headaches, nausea and some dizziness. Denies fever. Pt also endorses 1 month of stomach pain. She states that her LMP was November 20th. She is not currently sexually active, but was 1 month ago. Pt states that she is not using any forms of birth control. Endorses a vaginal odor and increased volume of clear discharge.   Home Medications Prior to Admission medications   Medication Sig Start Date End Date Taking? Authorizing Provider  busPIRone (BUSPAR) 5 MG tablet Take by mouth. 08/18/20   [provider]  diclofenac sodium (VOLTAREN) 1 % GEL Apply 2 g topically 4 (four) times daily. 11/15/17   Gwyneth Sprout, MD  omeprazole (PRILOSEC) 20 MG capsule Take 1 capsule by mouth daily. 05/30/21 08/28/21  [provider]  ondansetron (ZOFRAN) 4 MG tablet Take 1 tablet (4 mg total) by mouth every 6 (six) hours. 07/02/19   Hyman Hopes, Margaux, PA-C  potassium chloride (KLOR-CON) 10 MEQ tablet Take 1 tablet (10 mEq total) by mouth daily for 5 days. 07/02/19 07/07/19  Tanda Rockers, PA-C      Allergies    Robitussin 12 hour cough [dextromethorphan polistirex er]    Review of Systems   Review of Systems  Cardiovascular:  Positive for chest pain.  All other systems reviewed and are negative.   Physical Exam Updated Vital Signs BP (!) 127/90 (BP Location: Right Arm)    Pulse 78   Temp 97.9 F (36.6 C) (Oral)   Resp 18   Ht 5\' 2"  (1.575 m)   Wt 81.4 kg   LMP 04/10/2022   SpO2 100%   BMI 32.81 kg/m  Physical Exam Vitals and nursing note reviewed.  Constitutional:      General: She is not in acute distress.    Appearance: She is well-developed.  HENT:     Head: Atraumatic.  Eyes:     Conjunctiva/sclera: Conjunctivae normal.  Cardiovascular:     Rate and Rhythm: Normal rate and regular rhythm.     Pulses: Normal pulses.     Heart sounds: Normal heart sounds.  Pulmonary:     Effort: Pulmonary effort is normal.  Abdominal:     Palpations: Abdomen is soft.     Tenderness: There is no abdominal tenderness.  Musculoskeletal:        General: Tenderness (Mild tenderness to left paraspinal muscle and trapezius muscle with full range of motion about the shoulders.) present.     Cervical back: Normal range of motion and neck supple.  Skin:    Capillary Refill: Capillary refill takes less than 2 seconds.     Findings: No rash.  Neurological:     Mental Status: She is alert. Mental status is at baseline.  Psychiatric:        Mood and Affect: Mood normal.     ED Results /  Procedures / Treatments   Labs (all labs ordered are listed, but only abnormal results are displayed) Labs Reviewed  CBC - Abnormal; Notable for the following components:      Result Value   Hemoglobin 11.5 (*)    HCT 35.5 (*)    Platelets 431 (*)    All other components within normal limits  BASIC METABOLIC PANEL  POC URINE PREG, ED  TROPONIN I (HIGH SENSITIVITY)    EKG None ED ECG REPORT   Date: 04/25/2022  Rate: 73  Rhythm: normal sinus rhythm  QRS Axis: normal  Intervals: normal  ST/T Wave abnormalities: nonspecific T wave changes  Conduction Disutrbances:none  Narrative Interpretation:   Old EKG Reviewed: unchanged  I have personally reviewed the EKG tracing and agree with the computerized printout as noted.   Radiology DG Chest 2 View  Result  Date: 04/25/2022 CLINICAL DATA:  Chest pain EXAM: CHEST - 2 VIEW COMPARISON:  06/09/2021 FINDINGS: Cardiac size is within normal limits. Lung fields are clear of any infiltrates or pulmonary edema. There is no pleural effusion or pneumothorax. Soft tissue prominence in the right cardiophrenic angle has not changed consistent with prominent epicardial fat pad. IMPRESSION: No active cardiopulmonary disease. Electronically Signed   By: Ernie Avena M.D.   On: 04/25/2022 12:47    Procedures Procedures    Medications Ordered in ED Medications - No data to display  ED Course/ Medical Decision Making/ A&P                           Medical Decision Making Amount and/or Complexity of Data Reviewed Labs: ordered. Radiology: ordered.   BP (!) 127/90 (BP Location: Right Arm)   Pulse 78   Temp 97.9 F (36.6 C) (Oral)   Resp 18   Ht 5\' 2"  (1.575 m)   Wt 81.4 kg   LMP 04/10/2022   SpO2 100%   BMI 32.81 kg/m   1:57 PM  April Dean is a 31 y.o. female who presents today with concern for numbness and tingling from the left shoulder radiating down to her fingertips. She states this has been on and off since she woke up this morning. Pt denies significant chest pain, but does endorse increased work of breathing. Pt endorses headaches, nausea and some dizziness. Denies fever. Pt also endorses 1 month of stomach pain. She states that her LMP was November 20th. She is not currently sexually active, but was 1 month ago. Pt states that she is not using any forms of birth control. Endorses a vaginal odor and increased volume of clear discharge.   On exam, patient is resting comfortably in the chair appears to be in no acute discomfort.  She has mild tenderness noted to left trapezius muscle Left cervical paraspinal muscle but she has full range of motion about bilateral shoulders elbows and wrist with intact radial pulse.  Strength is intact bilaterally as well as sensation.  Heart and lung sounds  normal, abdomen soft nontender, patient ambulate without difficulty.  -Labs ordered, independently viewed and interpreted by me.  Labs remarkable for negative delta troponin, electrolytes panel are reassuring -The patient was maintained on a cardiac monitor.  I personally viewed and interpreted the cardiac monitored which showed an underlying rhythm of: NSR -Imaging independently viewed and interpreted by me and I agree with radiologist's interpretation.  Result remarkable for CXR showing no acute changes -This patient presents to the ED for concern of chest pain,  this involves an extensive number of treatment options, and is a complaint that carries with it a high risk of complications and morbidity.  The differential diagnosis includes ACS, stress, GERD, MSK, PE -Co morbidities that complicate the patient evaluation includes anxiety -Treatment includes OTC meds -Reevaluation of the patient after these medicines showed that the patient stayed the same -PCP office notes or outside notes reviewed -Escalation to admission/observation considered: patients feels much better, is comfortable with discharge, and will follow up with PCP -Prescription medication considered, patient comfortable with hydroxyzine -Social Determinant of Health considered   3:04 PM Patient endorsed chest pain radiates to left shoulder and arm as well as nausea dizziness lightheadedness since early this morning.  She does not have any significant cardiac history.  Workup today has been reassuring.  EKG without concerning ischemic changes, negative delta troponin, her hear score is 1 which puts her at low risk for Mace.  She is PERC negative, doubt PE.  No symptoms is likely suggestive of anxiety.  She also was concerns of vaginal discharge without any abdominal pain and states last sexual activity this was a month ago.  Encourage patient to have STI screening at her PCP office.  At this time patient is stable to be discharged home  with hydroxyzine to use as needed for anxiety.  Return precaution discussed.         Final Clinical Impression(s) / ED Diagnoses Final diagnoses:  Atypical chest pain  Anxiety    Rx / DC Orders ED Discharge Orders     None         Fayrene Helper, PA-C 04/25/22 1510    Benjiman Core, MD 04/26/22 1015

## 2022-04-25 NOTE — ED Triage Notes (Signed)
Pt in c/o CP onset x1 mth, pt states, "I have been having some things go on with my stomach and on the ride here my arm went numbness." Pt denies slurred speech changes, and denies visual changes, pt denies v/d, c/o nausea, pt A&O x4

## 2022-11-06 DIAGNOSIS — H1032 Unspecified acute conjunctivitis, left eye: Secondary | ICD-10-CM | POA: Diagnosis not present

## 2022-11-06 DIAGNOSIS — J0140 Acute pansinusitis, unspecified: Secondary | ICD-10-CM | POA: Diagnosis not present

## 2023-01-05 DIAGNOSIS — M791 Myalgia, unspecified site: Secondary | ICD-10-CM | POA: Diagnosis not present

## 2023-01-05 DIAGNOSIS — Z20822 Contact with and (suspected) exposure to covid-19: Secondary | ICD-10-CM | POA: Diagnosis not present

## 2023-01-05 DIAGNOSIS — S99921A Unspecified injury of right foot, initial encounter: Secondary | ICD-10-CM | POA: Diagnosis not present

## 2023-01-05 DIAGNOSIS — B349 Viral infection, unspecified: Secondary | ICD-10-CM | POA: Diagnosis not present

## 2023-04-15 DIAGNOSIS — R059 Cough, unspecified: Secondary | ICD-10-CM | POA: Diagnosis not present

## 2023-04-15 DIAGNOSIS — Z1152 Encounter for screening for COVID-19: Secondary | ICD-10-CM | POA: Diagnosis not present

## 2023-04-15 DIAGNOSIS — R051 Acute cough: Secondary | ICD-10-CM | POA: Diagnosis not present

## 2023-04-15 DIAGNOSIS — M791 Myalgia, unspecified site: Secondary | ICD-10-CM | POA: Diagnosis not present

## 2023-04-15 DIAGNOSIS — Z20822 Contact with and (suspected) exposure to covid-19: Secondary | ICD-10-CM | POA: Diagnosis not present

## 2023-04-15 DIAGNOSIS — J069 Acute upper respiratory infection, unspecified: Secondary | ICD-10-CM | POA: Diagnosis not present

## 2023-04-15 DIAGNOSIS — I2510 Atherosclerotic heart disease of native coronary artery without angina pectoris: Secondary | ICD-10-CM | POA: Diagnosis not present

## 2023-04-15 DIAGNOSIS — R0602 Shortness of breath: Secondary | ICD-10-CM | POA: Diagnosis not present

## 2023-04-15 DIAGNOSIS — F419 Anxiety disorder, unspecified: Secondary | ICD-10-CM | POA: Diagnosis not present

## 2023-07-28 IMAGING — CT CT CHEST W/ CM
2 of 4 series · 15 of 36 positions shown, 18 images · IV contrast (Omnipaque)
Comparison: Chest x-ray 06/09/2021.

CLINICAL DATA: Evaluate for mass.  Cough and fatigue.

EXAM:
CT CHEST WITH CONTRAST
TECHNIQUE: Multidetector CT imaging of the chest was performed during
intravenous contrast administration.

[Series 2: axial st · axial · 0.71mm/px · z∈[+630,+862]mm · 12 of 138 slices shown, 15 images]
[im 11/138  mediastinal]
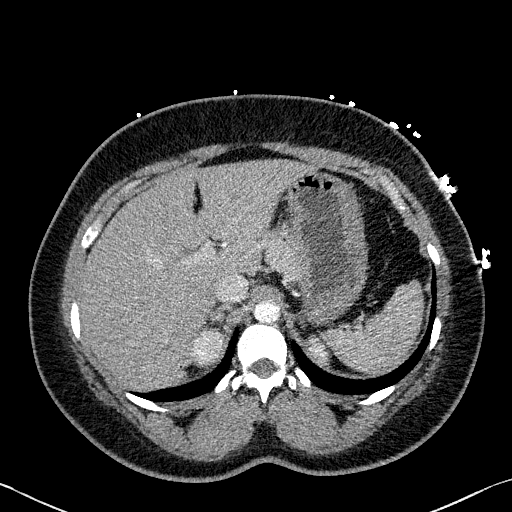
[im 11/138  lung]
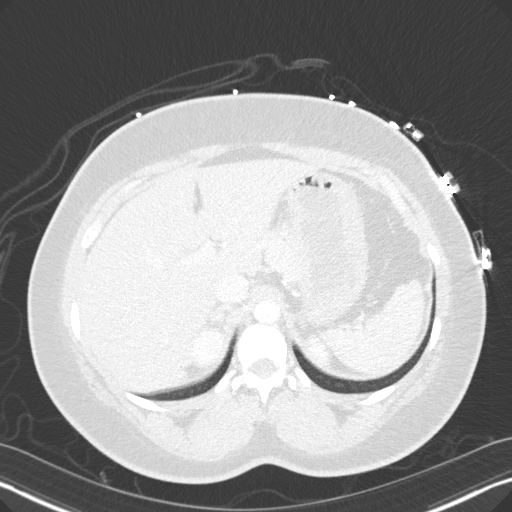
[im 22/138  lung]
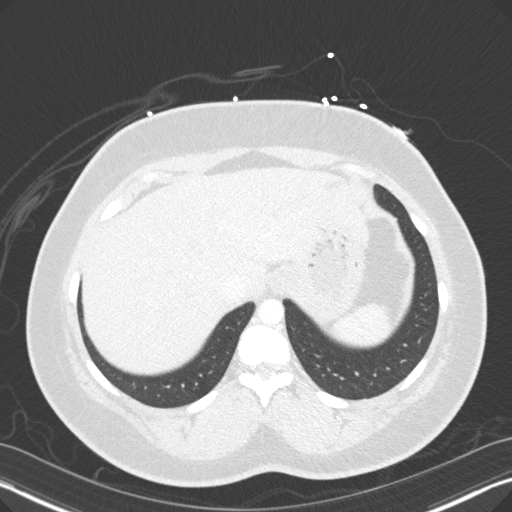
[im 32/138  lung]
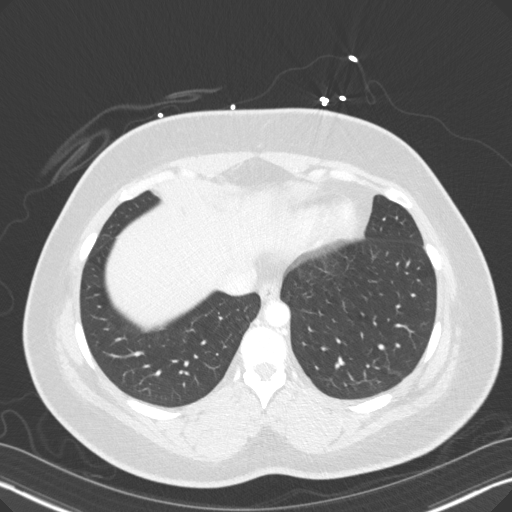
[im 43/138  lung]
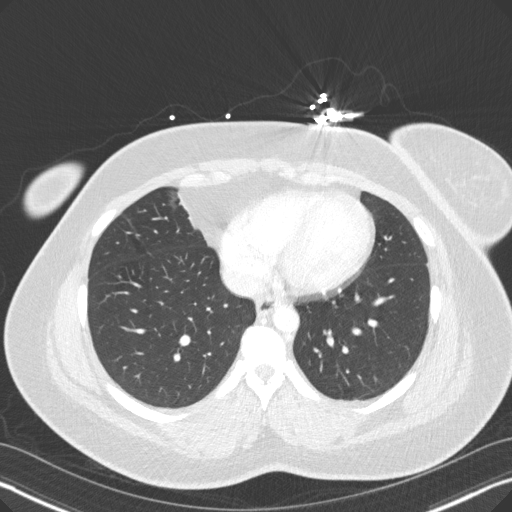
[im 53/138  mediastinal]
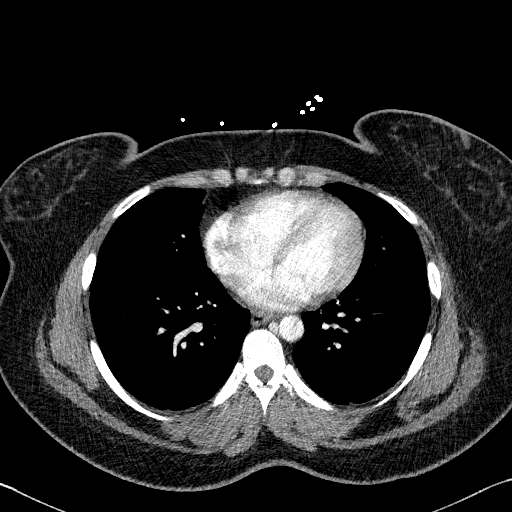
[im 53/138  lung]
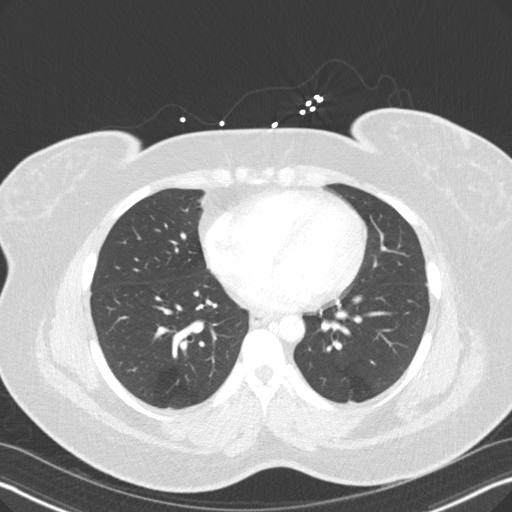
[im 64/138  lung]
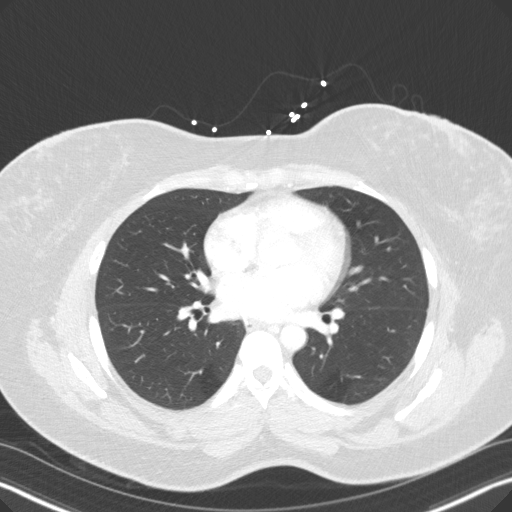
[im 74/138  lung]
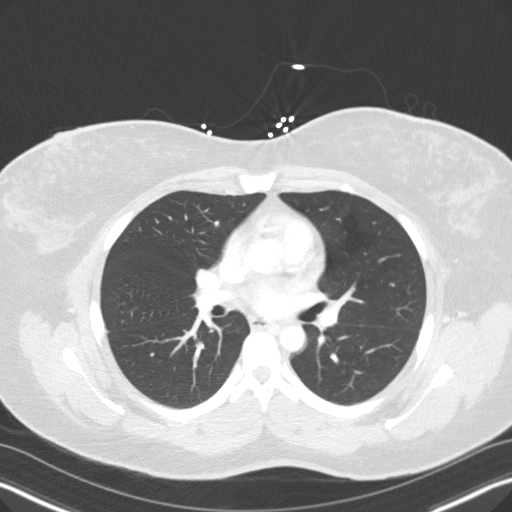
[im 85/138  lung]
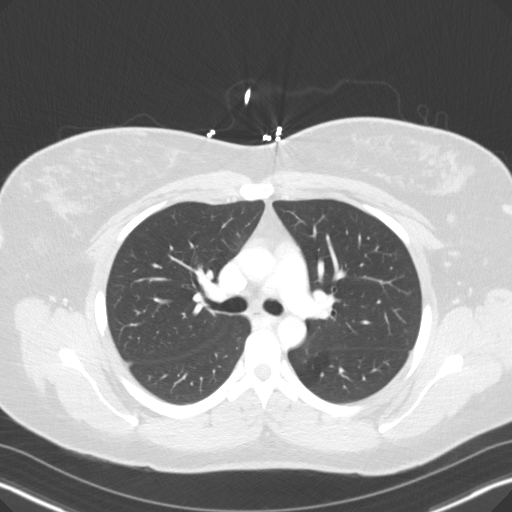
[im 95/138  mediastinal]
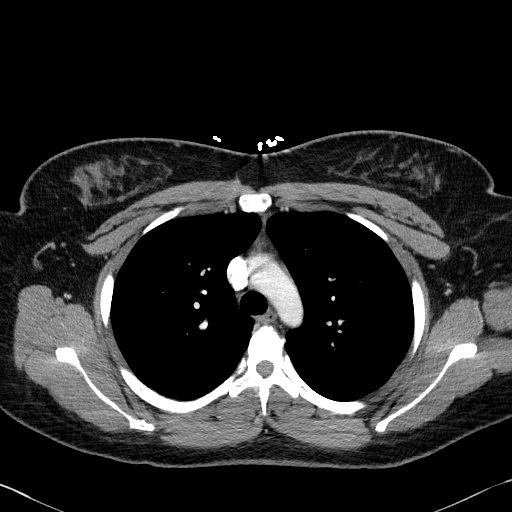
[im 95/138  lung]
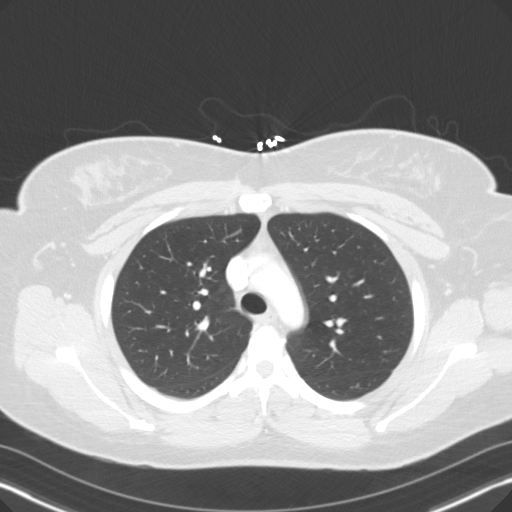
[im 106/138  lung]
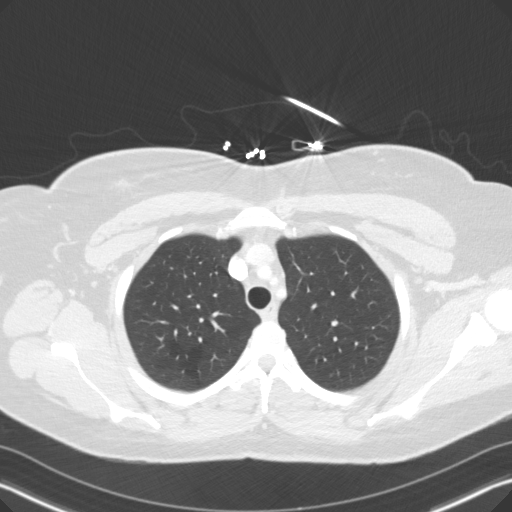
[im 116/138  lung]
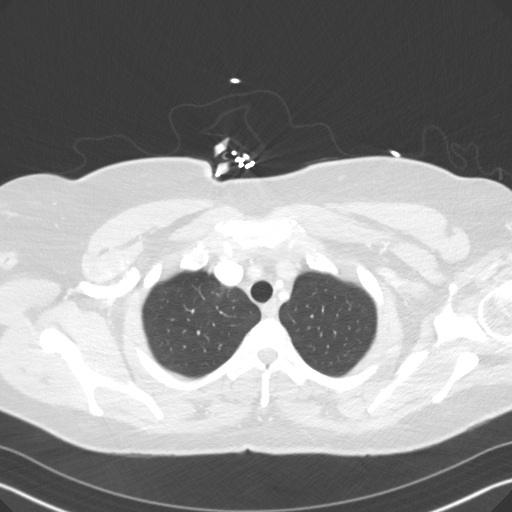
[im 127/138  lung]
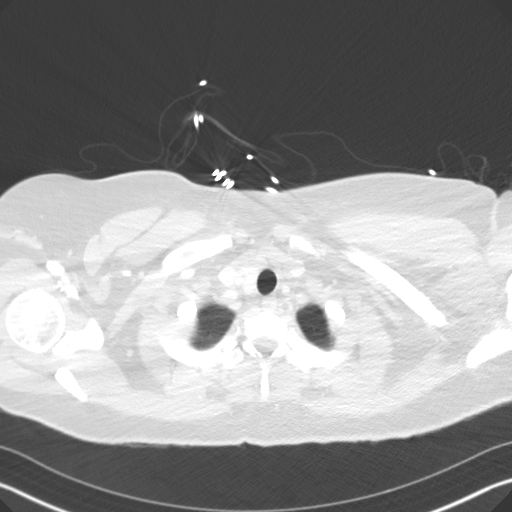

[Series 5: coronal · coronal · 0.59mm/px · 3 of 151 slices shown]
[im 31/151  lung]
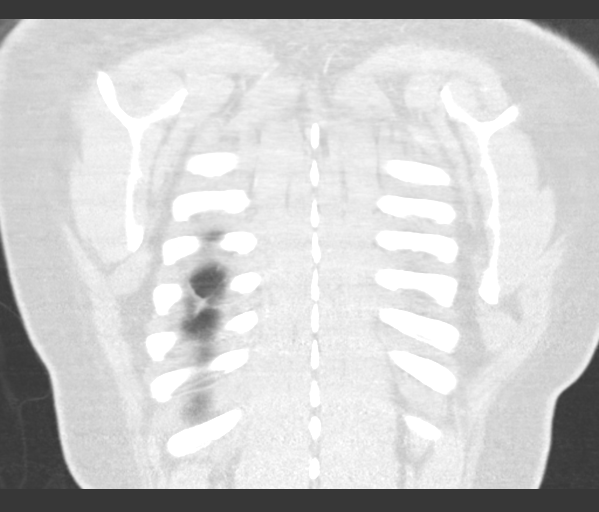
[im 61/151  lung]
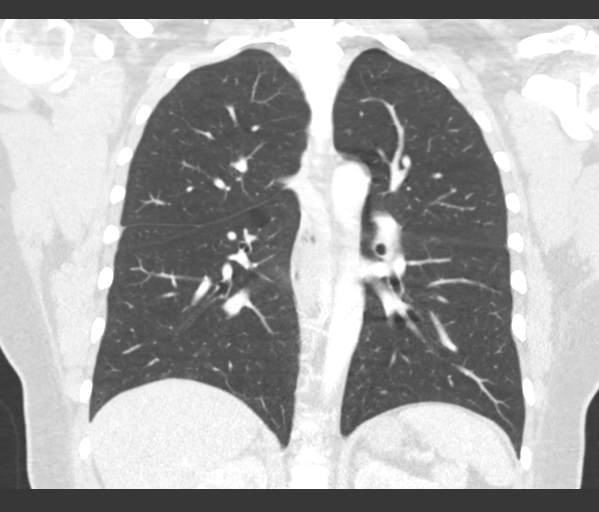
[im 91/151  lung]
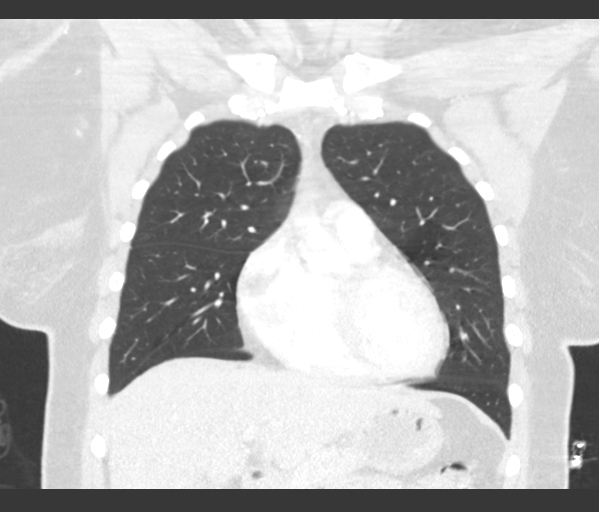

[15 of 36 positions shown; findings below may reference images not displayed]

RADIATION DOSE REDUCTION: This exam was performed according to the
departmental dose-optimization program which includes automated
exposure control, adjustment of the mA and/or kV according to
patient size and/or use of iterative reconstruction technique.

CONTRAST:  80mL OMNIPAQUE IOHEXOL 300 MG/ML  SOLN
FINDINGS: Cardiovascular: No significant vascular findings. Normal heart size.
No pericardial effusion.

Mediastinum/Nodes: No enlarged mediastinal, hilar, or axillary lymph
nodes. Thyroid gland, trachea, and esophagus demonstrate no
significant findings. There is a prominent right epicardial fat pad
corresponding to x-ray findings.

Lungs/Pleura: Lungs are clear. No pleural effusion or pneumothorax.

Upper Abdomen: No acute abnormality.

Musculoskeletal: No chest wall abnormality. No acute or significant
osseous findings.
IMPRESSION: 1. No acute cardiopulmonary process.
2. Prominent right epicardial fat pad corresponds to x-ray findings.
